# Patient Record
Sex: Female | Born: 1935 | Race: White | Hispanic: No | Marital: Married | Smoking: Never smoker
Health system: Southern US, Community
[De-identification: ages and names within clinical notes are randomized; demographics above are authoritative.]

## PROBLEM LIST (undated history)

## (undated) DIAGNOSIS — I5030 Unspecified diastolic (congestive) heart failure: Secondary | ICD-10-CM

## (undated) DIAGNOSIS — R000 Tachycardia, unspecified: Secondary | ICD-10-CM

## (undated) DIAGNOSIS — Z9889 Other specified postprocedural states: Secondary | ICD-10-CM

## (undated) DIAGNOSIS — R7989 Other specified abnormal findings of blood chemistry: Secondary | ICD-10-CM

## (undated) DIAGNOSIS — I739 Peripheral vascular disease, unspecified: Secondary | ICD-10-CM

## (undated) DIAGNOSIS — I350 Nonrheumatic aortic (valve) stenosis: Secondary | ICD-10-CM

## (undated) DIAGNOSIS — I10 Essential (primary) hypertension: Secondary | ICD-10-CM

## (undated) DIAGNOSIS — E785 Hyperlipidemia, unspecified: Secondary | ICD-10-CM

## (undated) DIAGNOSIS — C50919 Malignant neoplasm of unspecified site of unspecified female breast: Secondary | ICD-10-CM

## (undated) DIAGNOSIS — I272 Pulmonary hypertension, unspecified: Secondary | ICD-10-CM

## (undated) DIAGNOSIS — I4891 Unspecified atrial fibrillation: Secondary | ICD-10-CM

## (undated) DIAGNOSIS — K635 Polyp of colon: Secondary | ICD-10-CM

## (undated) DIAGNOSIS — C55 Malignant neoplasm of uterus, part unspecified: Secondary | ICD-10-CM

## (undated) DIAGNOSIS — K5790 Diverticulosis of intestine, part unspecified, without perforation or abscess without bleeding: Secondary | ICD-10-CM

## (undated) DIAGNOSIS — G40109 Localization-related (focal) (partial) symptomatic epilepsy and epileptic syndromes with simple partial seizures, not intractable, without status epilepticus: Secondary | ICD-10-CM

## (undated) DIAGNOSIS — R945 Abnormal results of liver function studies: Secondary | ICD-10-CM

## (undated) DIAGNOSIS — K7460 Unspecified cirrhosis of liver: Secondary | ICD-10-CM

## (undated) HISTORY — DX: Tachycardia, unspecified: R00.0

## (undated) HISTORY — DX: Peripheral vascular disease, unspecified: I73.9

## (undated) HISTORY — DX: Malignant neoplasm of unspecified site of unspecified female breast: C50.919

## (undated) HISTORY — PX: TOTAL ABDOMINAL HYSTERECTOMY: SHX209

## (undated) HISTORY — DX: Diverticulosis of intestine, part unspecified, without perforation or abscess without bleeding: K57.90

## (undated) HISTORY — DX: Unspecified diastolic (congestive) heart failure: I50.30

## (undated) HISTORY — DX: Malignant neoplasm of uterus, part unspecified: C55

## (undated) HISTORY — DX: Hyperlipidemia, unspecified: E78.5

## (undated) HISTORY — DX: Other specified abnormal findings of blood chemistry: R79.89

## (undated) HISTORY — DX: Pulmonary hypertension, unspecified: I27.20

## (undated) HISTORY — DX: Localization-related (focal) (partial) symptomatic epilepsy and epileptic syndromes with simple partial seizures, not intractable, without status epilepticus: G40.109

## (undated) HISTORY — PX: BREAST LUMPECTOMY: SHX2

## (undated) HISTORY — DX: Polyp of colon: K63.5

## (undated) HISTORY — DX: Unspecified cirrhosis of liver: K74.60

## (undated) HISTORY — DX: Other specified postprocedural states: Z98.890

## (undated) HISTORY — DX: Abnormal results of liver function studies: R94.5

---

## 2001-06-23 ENCOUNTER — Encounter: Payer: Self-pay | Admitting: Internal Medicine

## 2001-06-23 ENCOUNTER — Encounter: Admission: RE | Admit: 2001-06-23 | Discharge: 2001-06-23 | Payer: Self-pay | Admitting: Internal Medicine

## 2003-07-05 ENCOUNTER — Encounter: Payer: Self-pay | Admitting: Family Medicine

## 2003-07-05 ENCOUNTER — Encounter: Admission: RE | Admit: 2003-07-05 | Discharge: 2003-07-05 | Payer: Self-pay | Admitting: Family Medicine

## 2004-10-23 ENCOUNTER — Ambulatory Visit: Payer: Self-pay | Admitting: Internal Medicine

## 2005-02-26 ENCOUNTER — Encounter: Admission: RE | Admit: 2005-02-26 | Discharge: 2005-02-26 | Payer: Self-pay | Admitting: Internal Medicine

## 2005-03-25 ENCOUNTER — Ambulatory Visit: Payer: Self-pay | Admitting: Internal Medicine

## 2005-04-16 ENCOUNTER — Ambulatory Visit: Payer: Self-pay | Admitting: Internal Medicine

## 2005-04-29 ENCOUNTER — Ambulatory Visit: Payer: Self-pay

## 2005-07-03 ENCOUNTER — Ambulatory Visit: Payer: Self-pay | Admitting: Internal Medicine

## 2005-07-15 ENCOUNTER — Encounter: Payer: Self-pay | Admitting: Internal Medicine

## 2005-07-15 ENCOUNTER — Encounter (INDEPENDENT_AMBULATORY_CARE_PROVIDER_SITE_OTHER): Payer: Self-pay | Admitting: Specialist

## 2005-07-15 ENCOUNTER — Ambulatory Visit: Payer: Self-pay | Admitting: Internal Medicine

## 2005-10-23 ENCOUNTER — Ambulatory Visit: Payer: Self-pay | Admitting: Internal Medicine

## 2005-12-08 HISTORY — PX: MASTECTOMY: SHX3

## 2006-03-18 ENCOUNTER — Encounter: Admission: RE | Admit: 2006-03-18 | Discharge: 2006-03-18 | Payer: Self-pay | Admitting: Internal Medicine

## 2006-03-25 ENCOUNTER — Encounter: Admission: RE | Admit: 2006-03-25 | Discharge: 2006-03-25 | Payer: Self-pay | Admitting: Internal Medicine

## 2006-03-25 ENCOUNTER — Encounter (INDEPENDENT_AMBULATORY_CARE_PROVIDER_SITE_OTHER): Payer: Self-pay | Admitting: Diagnostic Radiology

## 2006-03-25 ENCOUNTER — Encounter (INDEPENDENT_AMBULATORY_CARE_PROVIDER_SITE_OTHER): Payer: Self-pay | Admitting: Specialist

## 2006-03-30 ENCOUNTER — Encounter: Admission: RE | Admit: 2006-03-30 | Discharge: 2006-03-30 | Payer: Self-pay | Admitting: Internal Medicine

## 2006-03-31 ENCOUNTER — Ambulatory Visit: Payer: Self-pay | Admitting: Internal Medicine

## 2006-04-07 ENCOUNTER — Inpatient Hospital Stay (HOSPITAL_COMMUNITY): Admission: RE | Admit: 2006-04-07 | Discharge: 2006-04-08 | Payer: Self-pay | Admitting: General Surgery

## 2006-04-07 ENCOUNTER — Encounter (INDEPENDENT_AMBULATORY_CARE_PROVIDER_SITE_OTHER): Payer: Self-pay | Admitting: *Deleted

## 2006-07-15 ENCOUNTER — Ambulatory Visit (HOSPITAL_BASED_OUTPATIENT_CLINIC_OR_DEPARTMENT_OTHER): Admission: RE | Admit: 2006-07-15 | Discharge: 2006-07-15 | Payer: Self-pay | Admitting: *Deleted

## 2006-09-17 ENCOUNTER — Ambulatory Visit: Payer: Self-pay | Admitting: Internal Medicine

## 2006-10-02 ENCOUNTER — Encounter: Payer: Self-pay | Admitting: Internal Medicine

## 2006-10-02 ENCOUNTER — Ambulatory Visit: Payer: Self-pay

## 2006-11-02 ENCOUNTER — Ambulatory Visit: Payer: Self-pay | Admitting: Cardiology

## 2007-04-01 ENCOUNTER — Ambulatory Visit: Payer: Self-pay | Admitting: Internal Medicine

## 2007-04-15 DIAGNOSIS — C50919 Malignant neoplasm of unspecified site of unspecified female breast: Secondary | ICD-10-CM | POA: Insufficient documentation

## 2007-04-15 DIAGNOSIS — I10 Essential (primary) hypertension: Secondary | ICD-10-CM | POA: Insufficient documentation

## 2007-04-15 DIAGNOSIS — Z8542 Personal history of malignant neoplasm of other parts of uterus: Secondary | ICD-10-CM | POA: Insufficient documentation

## 2007-07-20 ENCOUNTER — Telehealth (INDEPENDENT_AMBULATORY_CARE_PROVIDER_SITE_OTHER): Payer: Self-pay | Admitting: *Deleted

## 2007-10-08 ENCOUNTER — Encounter (INDEPENDENT_AMBULATORY_CARE_PROVIDER_SITE_OTHER): Payer: Self-pay | Admitting: *Deleted

## 2007-10-22 ENCOUNTER — Ambulatory Visit: Payer: Self-pay | Admitting: Internal Medicine

## 2007-10-22 DIAGNOSIS — M949 Disorder of cartilage, unspecified: Secondary | ICD-10-CM

## 2007-10-22 DIAGNOSIS — I739 Peripheral vascular disease, unspecified: Secondary | ICD-10-CM

## 2007-10-22 DIAGNOSIS — E785 Hyperlipidemia, unspecified: Secondary | ICD-10-CM

## 2007-10-22 DIAGNOSIS — M899 Disorder of bone, unspecified: Secondary | ICD-10-CM | POA: Insufficient documentation

## 2007-10-25 LAB — CONVERTED CEMR LAB
Alkaline Phosphatase: 67 units/L (ref 39–117)
BUN: 10 mg/dL (ref 6–23)
Basophils Absolute: 0 10*3/uL (ref 0.0–0.1)
CO2: 29 meq/L (ref 19–32)
Chloride: 101 meq/L (ref 96–112)
Cholesterol: 162 mg/dL (ref 0–200)
Creatinine, Ser: 0.6 mg/dL (ref 0.4–1.2)
Eosinophils Absolute: 0.1 10*3/uL (ref 0.0–0.6)
GFR calc Af Amer: 127 mL/min
GFR calc non Af Amer: 105 mL/min
Glucose, Bld: 96 mg/dL (ref 70–99)
HDL: 89.3 mg/dL (ref >39.0)
Hemoglobin: 14.7 g/dL (ref 12.0–15.0)
MCHC: 34.9 g/dL (ref 30.0–36.0)
MCV: 90.8 fL (ref 78.0–100.0)
Monocytes Absolute: 0.6 10*3/uL (ref 0.2–0.7)
Neutro Abs: 4.9 10*3/uL (ref 1.4–7.7)
Sodium: 140 meq/L (ref 135–145)
Total Bilirubin: 1.1 mg/dL (ref 0.3–1.2)
Total Protein: 7.8 g/dL (ref 6.0–8.3)
Triglycerides: 84 mg/dL (ref 0–149)

## 2007-11-09 ENCOUNTER — Encounter: Payer: Self-pay | Admitting: Internal Medicine

## 2007-11-18 ENCOUNTER — Encounter: Admission: RE | Admit: 2007-11-18 | Discharge: 2007-11-18 | Payer: Self-pay | Admitting: Internal Medicine

## 2008-02-01 ENCOUNTER — Telehealth (INDEPENDENT_AMBULATORY_CARE_PROVIDER_SITE_OTHER): Payer: Self-pay | Admitting: *Deleted

## 2008-06-20 ENCOUNTER — Ambulatory Visit: Payer: Self-pay | Admitting: Internal Medicine

## 2008-06-20 DIAGNOSIS — H9190 Unspecified hearing loss, unspecified ear: Secondary | ICD-10-CM | POA: Insufficient documentation

## 2008-06-26 ENCOUNTER — Telehealth: Payer: Self-pay | Admitting: Internal Medicine

## 2008-06-26 ENCOUNTER — Telehealth (INDEPENDENT_AMBULATORY_CARE_PROVIDER_SITE_OTHER): Payer: Self-pay | Admitting: *Deleted

## 2008-06-26 LAB — CONVERTED CEMR LAB
ALT: 18 units/L (ref 0–35)
AST: 23 units/L (ref 0–37)
BUN: 17 mg/dL (ref 6–23)
Creatinine, Ser: 1.1 mg/dL (ref 0.4–1.2)
GFR calc Af Amer: 63 mL/min
GFR calc non Af Amer: 52 mL/min
Glucose, Bld: 95 mg/dL (ref 70–99)
Sodium: 139 meq/L (ref 135–145)

## 2008-07-03 ENCOUNTER — Encounter: Payer: Self-pay | Admitting: Internal Medicine

## 2008-07-26 ENCOUNTER — Ambulatory Visit: Payer: Self-pay | Admitting: Internal Medicine

## 2008-07-28 ENCOUNTER — Telehealth (INDEPENDENT_AMBULATORY_CARE_PROVIDER_SITE_OTHER): Payer: Self-pay | Admitting: *Deleted

## 2008-07-28 LAB — CONVERTED CEMR LAB
BUN: 13 mg/dL (ref 6–23)
GFR calc Af Amer: 79 mL/min
Glucose, Bld: 98 mg/dL (ref 70–99)
Potassium: 3.9 meq/L (ref 3.5–5.1)
Sodium: 141 meq/L (ref 135–145)

## 2008-10-31 ENCOUNTER — Ambulatory Visit: Payer: Self-pay | Admitting: Internal Medicine

## 2008-11-03 ENCOUNTER — Telehealth (INDEPENDENT_AMBULATORY_CARE_PROVIDER_SITE_OTHER): Payer: Self-pay | Admitting: *Deleted

## 2008-11-03 LAB — CONVERTED CEMR LAB
Basophils Absolute: 0 10*3/uL (ref 0.0–0.1)
Eosinophils Absolute: 0.1 10*3/uL (ref 0.0–0.7)
HCT: 42.4 % (ref 36.0–46.0)
HDL: 95.6 mg/dL (ref >39.0)
Hemoglobin: 14.8 g/dL (ref 12.0–15.0)
Lymphocytes Relative: 18.1 % (ref 12.0–46.0)
MCV: 89.8 fL (ref 78.0–100.0)
Monocytes Absolute: 0.5 10*3/uL (ref 0.1–1.0)
Neutro Abs: 4.9 10*3/uL (ref 1.4–7.7)
Neutrophils Relative %: 72.2 % (ref 43.0–77.0)
RDW: 11.9 % (ref 11.5–14.6)
Total CHOL/HDL Ratio: 1.9
Vit D, 1,25-Dihydroxy: 34 (ref 30–89)
WBC: 6.7 10*3/uL (ref 4.5–10.5)

## 2009-08-03 ENCOUNTER — Ambulatory Visit: Payer: Self-pay | Admitting: Internal Medicine

## 2009-08-07 ENCOUNTER — Encounter (INDEPENDENT_AMBULATORY_CARE_PROVIDER_SITE_OTHER): Payer: Self-pay | Admitting: *Deleted

## 2009-08-07 LAB — CONVERTED CEMR LAB
ALT: 17 units/L (ref 0–35)
CO2: 31 meq/L (ref 19–32)
Calcium: 9.5 mg/dL (ref 8.4–10.5)
Chloride: 104 meq/L (ref 96–112)
Creatinine, Ser: 0.9 mg/dL (ref 0.4–1.2)
HDL: 78.5 mg/dL (ref >39.00)
LDL Cholesterol: 75 mg/dL (ref 0–99)
Potassium: 4 meq/L (ref 3.5–5.1)
Sodium: 141 meq/L (ref 135–145)
Total CHOL/HDL Ratio: 2
VLDL: 10.2 mg/dL (ref 0.0–40.0)

## 2009-12-26 ENCOUNTER — Encounter: Payer: Self-pay | Admitting: Internal Medicine

## 2010-01-23 ENCOUNTER — Ambulatory Visit: Payer: Self-pay | Admitting: Internal Medicine

## 2010-01-30 ENCOUNTER — Encounter: Admission: RE | Admit: 2010-01-30 | Discharge: 2010-01-30 | Payer: Self-pay | Admitting: Internal Medicine

## 2010-06-21 ENCOUNTER — Telehealth: Payer: Self-pay | Admitting: Internal Medicine

## 2010-06-24 ENCOUNTER — Encounter: Payer: Self-pay | Admitting: Internal Medicine

## 2010-12-29 ENCOUNTER — Encounter: Payer: Self-pay | Admitting: Internal Medicine

## 2011-01-05 LAB — CONVERTED CEMR LAB
AST: 22 units/L (ref 0–37)
BUN: 12 mg/dL (ref 6–23)
Basophils Relative: 1.6 % (ref 0.0–3.0)
CO2: 31 meq/L (ref 19–32)
Calcium: 9.4 mg/dL (ref 8.4–10.5)
Calcium: 9.7 mg/dL (ref 8.4–10.5)
Chloride: 103 meq/L (ref 96–112)
Cholesterol: 197 mg/dL (ref 0–200)
Direct LDL: 126.5 mg/dL
Eosinophils Absolute: 0.1 10*3/uL (ref 0.0–0.7)
Eosinophils Relative: 1 % (ref 0.0–5.0)
GFR calc Af Amer: 91 mL/min
GFR calc non Af Amer: 74.81 mL/min (ref >60)
GFR calc non Af Amer: 76 mL/min
Glucose, Bld: 99 mg/dL (ref 70–99)
HCT: 40.9 % (ref 36.0–46.0)
HDL: 82 mg/dL (ref >39.0)
HDL: 91 mg/dL (ref >39.00)
Hemoglobin: 13.5 g/dL (ref 12.0–15.0)
Hemoglobin: 14.6 g/dL (ref 12.0–15.0)
Lymphs Abs: 1 10*3/uL (ref 0.7–4.0)
Monocytes Absolute: 0.5 10*3/uL (ref 0.1–1.0)
Neutrophils Relative %: 69.2 % (ref 43.0–77.0)
Potassium: 4 meq/L (ref 3.5–5.1)
Potassium: 4.1 meq/L (ref 3.5–5.1)
RBC: 4.45 M/uL (ref 3.87–5.11)
Total CHOL/HDL Ratio: 2
Triglycerides: 118 mg/dL (ref 0–149)
Triglycerides: 94 mg/dL (ref 0.0–149.0)
VLDL: 24 mg/dL (ref 0–40)

## 2011-01-07 NOTE — Letter (Signed)
Summary: Colonoscopy Letter  [facility]  [address] [place], [place] [address]   Phone: [phone]  Fax: [phone]      June 24, 2010 MRN: [id number]   [facility] [address] [place], [place]  [address]   Dear Ms. Osmon,   According to your medical record, it is time for you to schedule a Colonoscopy. The American Cancer Society recommends this procedure as a method to detect early colon cancer. Patients with a family history of colon cancer, or a personal history of colon polyps or inflammatory bowel disease are at increased risk.  This letter has been generated based on the recommendations made at the time of your procedure. If you feel that in your particular situation this may no longer apply, please contact our office.  Please call our office at [phone] to schedule this appointment or to update your records at your earliest convenience.  Thank you for cooperating with [place] to provide you with the very best care possible.   Sincerely,  Wilhemina Bonito. Marina Goodell, M.D.  [facility] [phone]

## 2011-01-07 NOTE — Progress Notes (Signed)
Summary: refill   Phone Note Call from Patient   Summary of Call: Pt called stating she needs a new rx for her Simvastatin. Sent in refill to pharmacy for pt. Army Fossa CMA  June 21, 2010 2:04 PM     refilPrescriptions: SIMVASTATIN 40 MG  TABS (SIMVASTATIN) 1/4 by mouth at bedtime  #30 x 0   Entered by:   Army Fossa CMA   Authorized by:   Nolon Rod. Ahnesty Finfrock MD   Signed by:   Army Fossa CMA on 06/21/2010   Method used:   Electronically to        [facility]* (retail)             ,          Ph: [phone]       Fax: [phone]   RxID:   [id number]

## 2011-01-07 NOTE — Assessment & Plan Note (Signed)
Summary: CPX,WANTS TO FAST,MEDICARE & BCBS/RH.....   Vital Signs:  Patient profile:   76 year old female Height:      66 inches Weight:      165.6 pounds BMI:     26.83 Pulse rate:   60 / minute BP sitting:   124 / 86  Vitals Entered By: Shary Decamp (January 23, 2010 11:22 AM) CC: yearly - fasting Comments  - has breast MRI scheduled for 2/23, needs bun/creat Shary Decamp  January 23, 2010 11:22 AM    History of Present Illness: Hypertension-- good medication compliance , ambulatory BPs excellent   Hyperlipidemia-- good medication compliance   Osteopenia-- due for DEXA  h/o BREAST CANCER -- needs a MRI  Preventive Screening-Counseling & Management  Alcohol-Tobacco     Alcohol drinks/day: 2     Alcohol type: wine  Caffeine-Diet-Exercise     Does Patient Exercise: yes     Type of exercise: walks daily     Times/week: 7      Drug Use:  no.    Current Medications (verified): 1)  Simvastatin 40 Mg  Tabs (Simvastatin) .... 1/2 By Mouth At Bedtime 2)  Benazepril-Hydrochlorothiazide 20-12.5 Mg  Tabs (Benazepril-Hydrochlorothiazide) .... 2 By Mouth Once Daily 3)  Aspir-Low 81 Mg  Tbec (Aspirin) .... Once Daily  Allergies (verified): No Known Drug Allergies  Comments:  Nurse/Medical Assistant: The patient's medications were reviewed with the patient and were updated in the Medication List. Shary Decamp (January 23, 2010 11:23 AM)  Past History:  Past Medical History: Reviewed history from 08/03/2009 and no changes required. Hypertension Hyperlipidemia Osteopenia PVD, saw Dr Samule Ohm 2007, severe but Asx R Common Iliac artery stenosis October 2007 an abdominal aorta ultrasound was negative for AAA   UTERINE CANCER, HX OF --Dx in the 1990s BREAST CANCER-BILATERAL  Past Surgical History: mastectomy -bilateral  2007 Hysterectomy, oophorectomy B  Family History: colon ca-- no breast ca--daughter  MI-- F age early 71s DM-- no  Social History: Married x  50 years  4 daughters Retired, used to do office work diet-- healthy except for cheese  exercise-- daily tobacco-- never ETOH-- wine daily Does Patient Exercise:  yes Drug Use:  no  Review of Systems General:  Denies fatigue, fever, and weight loss. CV:  Denies chest pain or discomfort and swelling of feet; (-) claudication. Resp:  Denies cough and shortness of breath. GI:  Denies bloody stools, nausea, and vomiting. Psych:  Denies anxiety and depression.  Physical Exam  General:  alert, well-developed, and well-nourished.   Neck:  no masses, no thyromegaly, and normal carotid upstroke.   Lungs:  normal respiratory effort, no intercostal retractions, no accessory muscle use, and normal breath sounds.   Heart:  normal rate, regular rhythm, no murmur, and no gallop.   Abdomen:  soft and non-tender.  pulsatile mass (or Ao) at the mid  abdomen, no bruits, no tenderness Pulses:  normal  femoral  pulses Extremities:  no lower extremity edema Psych:  Cognition and judgment appear intact. Alert and cooperative with normal attention span and concentration. not anxious appearing and not depressed appearing.     Impression & Recommendations:  Problem # 1:  OSTEOPENIA (ICD-733.90) last bone density test 2006 decline to  have a repeated DEXA because she has a healthy lifestyle and is very active to call if changes her mind  Orders: Venipuncture (62130) T-Vitamin D (25-Hydroxy) (86578-46962)  Problem # 2:  HEALTH MAINTENANCE EXAM (ICD-V70.0) Td 2008 pneumonia   2008 had  shingles shot already had a flu shot   sees gynecology for her female care oncology does her breast exam  colonoscopy in 2006, had hperplastic  polyps,next Cscope per Dr Marina Goodell  Problem # 3:  PVD (ICD-443.9) asymptomatic palpable aorta on exam, this has been the case since at least 2007; note from cardiology reviewed: "The abdominal aorta is quite palpable and nontender" no AAA by ultrasound 2007   plan:  Observation, consider re-do a u/s next year  Problem # 4:  HYPERLIPIDEMIA (ICD-272.4) at goal  patient self cut simva to 1/4 a day x months  likes FLP done, if it is still ok, would like to be switched to 10mg  (officially) Her updated medication list for this problem includes:    Simvastatin 40 Mg Tabs (Simvastatin) .Marland Kitchen... 1/4 by mouth at bedtime  Labs Reviewed: SGOT: 24 (08/03/2009)   SGPT: 17 (08/03/2009)   HDL:78.50 (08/03/2009), 95.6 (10/31/2008)  LDL:75 (08/03/2009), 68 (10/31/2008)  Chol:164 (08/03/2009), 180 (10/31/2008)  Trig:51.0 (08/03/2009), 82 (10/31/2008)  Orders: TLB-Lipid Panel (80061-LIPID)  Problem # 5:  ADENOCARCINOMA, BREAST (ICD-174.9) history of breast cancer, in need of MRI w/  contrast  Will forward  BMP to oncology  Problem # 6:  HYPERTENSION (ICD-401.9) at goal  EKG sinus bradycardia, no acute changes Her updated medication list for this problem includes:    Benazepril-hydrochlorothiazide 20-12.5 Mg Tabs (Benazepril-hydrochlorothiazide) .Marland Kitchen... 2 by mouth once daily    BP today: 124/86 Prior BP: 120/82 (08/03/2009)  Labs Reviewed: K+: 4.0 (08/03/2009) Creat: : 0.9 (08/03/2009)   Chol: 164 (08/03/2009)   HDL: 78.50 (08/03/2009)   LDL: 75 (08/03/2009)   TG: 51.0 (08/03/2009)  Orders: TLB-BMP (Basic Metabolic Panel-BMET) (80048-METABOL) TLB-CBC Platelet - w/Differential (85025-CBCD) EKG w/ Interpretation (93000)  Complete Medication List: 1)  Simvastatin 40 Mg Tabs (Simvastatin) .... 1/4 by mouth at bedtime 2)  Benazepril-hydrochlorothiazide 20-12.5 Mg Tabs (Benazepril-hydrochlorothiazide) .... 2 by mouth once daily 3)  Aspir-low 81 Mg Tbec (Aspirin) .... Once daily  Patient Instructions: 1)  Please schedule a follow-up appointment in 6 months .  Prescriptions: BENAZEPRIL-HYDROCHLOROTHIAZIDE 20-12.5 MG  TABS (BENAZEPRIL-HYDROCHLOROTHIAZIDE) 2 by mouth once daily  #180 x 1   Entered by:   Shary Decamp   Authorized by:   Nolon Rod. Kellin Bartling MD   Signed by:    Shary Decamp on 01/23/2010   Method used:   Electronically to        [facility]* (mail-order)             ,          Ph: [phone]       Fax: [phone]   RxID:   [id number]    Preventive Care Screening  Prior Values:    Colonoscopy:  Diverticulosis; polyps (07/15/2005)    Last Tetanus Booster:  Td (04/01/2007)    Last Flu Shot:  Historical (09/14/2007)    Last Pneumovax:  Pneumovax (04/01/2007)    Risk Factors:  Tobacco use:  never Drug use:  no Alcohol use:  yes    Type:  wine    Drinks per day:  2 Exercise:  yes    Times per week:  7    Type:  walks daily  Colonoscopy History:    Date of Last Colonoscopy:  07/15/2005    Immunization History:  Influenza Immunization History:    Influenza:  historical (09/07/2009)

## 2011-01-07 NOTE — Letter (Signed)
Summary: Cancer Registry Form/[facility]  Cancer Registry Form/[facility]   Imported By: Lanelle Bal 01/04/2010 11:33:28  _____________________________________________________________________  External Attachment:    Type:   Image     Comment:   External Document

## 2011-03-19 ENCOUNTER — Telehealth: Payer: Self-pay | Admitting: *Deleted

## 2011-03-19 MED ORDER — BENAZEPRIL-HYDROCHLOROTHIAZIDE 20-12.5 MG PO TABS: 2.0000 | ORAL_TABLET | Freq: Every day | ORAL | Status: DC

## 2011-03-19 NOTE — Telephone Encounter (Signed)
Spoke w/ pt she schedule appt. Needs refill on Benazepril-hctz 20-12.5mg .

## 2011-03-25 ENCOUNTER — Other Ambulatory Visit: Payer: Self-pay | Admitting: *Deleted

## 2011-03-26 ENCOUNTER — Other Ambulatory Visit: Payer: Self-pay | Admitting: *Deleted

## 2011-03-26 MED ORDER — BENAZEPRIL-HYDROCHLOROTHIAZIDE 20-12.5 MG PO TABS: 2.0000 | ORAL_TABLET | Freq: Every day | ORAL | Status: DC

## 2011-03-26 NOTE — Telephone Encounter (Signed)
Pt called wanted to have rx sent to local pharmacy

## 2011-04-09 ENCOUNTER — Encounter: Payer: Self-pay | Admitting: Internal Medicine

## 2011-04-25 NOTE — Discharge Summary (Signed)
NAMENATHANIEL, [name]                ACCOUNT NO.:  [id number]   MEDICAL RECORD NO.:  [id number]          PATIENT TYPE:  INP   LOCATION:  5740                         FACILITY:  [facility]   PHYSICIAN:  Lyman Speller, MD       DATE OF BIRTH:  1935-09-14   DATE OF ADMISSION:  04/07/2006  DATE OF DISCHARGE:  04/08/2006                                 DISCHARGE SUMMARY   ADMISSION DIAGNOSIS:  Recurrent carcinoma to the right breast.   DISCHARGE DIAGNOSIS:  Recurrent carcinoma to the right breast.   OPERATIONS:  1.  Bilateral total mastectomies done by Dr. Rose Phi. Young.  2.  Placement of bilateral subpectoral tissue expanders done by Dr. Ashley Murrain      Bean.   HOSPITAL COURSE:  The patient was taken to the operating room on Apr 07, 2006; at which time, she underwent mastectomies by Dr. Maple Hudson followed by  placement of bilateral subpectoral tissue expanders by Dr. Jillyn Hidden.  During the  course of the placement of the left subpectoral tissue expander, a 5 mm tear  was placed in the left pleura.  This was aspirated and closed at the time of  surgery.  Postoperative chest x-ray revealed a minimal pneumothorax on the  left with no other significant findings.   The patient's postoperative course has otherwise been completely  unremarkable.  Her vital signs are stable and she is afebrile.  She is  tolerating her diet and her pain is well controlled.  Chest x-ray this  morning reveals the minimal pneumothorax on the left to be resolving.  Physical examination today reveals the wounds to be clean and dry.  The skin  flaps are pink and viable. The Jackson-Pratt drains are in place and  functioning without difficulty.  The patient will be discharged home today  for office follow-up.   DISCHARGE INSTRUCTIONS:  The patient is advised to engage in no strenuous  upper extremity activity.  Her current dressing is to be left in place until  seen by me on Friday.  I have instructed her not to drive for one week  and  no heavy lifting for three weeks.  She may walk up steps.  I have advised  her to sponge bath only until such time as the drains are removed.   DISCHARGE MEDICATIONS:  1.  Percocet tablets 1-2 every 4 hours p.r.n. pain, dispense #40.  2.  Cephalexin 250 mg p.o. q.i.d. for 7 days.   FOLLOW UP:  Office will contact the patient to schedule follow up for  Friday, Apr 10, 2006.   FINAL DIAGNOSIS:  Breast carcinoma as noted above.     Lyman Speller, MD  Electronically Signed    CWB/MEDQ  D:  04/08/2006  T:  04/09/2006  Job:  [id number]

## 2011-04-25 NOTE — Progress Notes (Signed)
[facility]                          PERIPHERAL VASCULAR OFFICE NOTE   NAME:[name], [name]                       MRN:          [id number]  DATE:11/02/2006                            DOB:          [birth year]    REASON FOR CONSULTATION:  Patient referred by Dr. Drue Novel for right common iliac  stenosis noted on abdominal aortic ultrasound.   HISTORY OF PRESENT ILLNESS:  [name] is a 76 year old lady with  hypertension.  She is quite active.  She walks 3 miles every day under an  hour, including doing hills. With this, she has absolutely no discomfort in  either leg.   Dr. Drue Novel recently felt pulsatile aorta and ordered an abdominal aortic  ultrasound to exclude and aneurysm.  Showed no evidence of aneurysm but  showed a severe stenosis at the ostium of the right common iliac artery with  a peak systolic velocity of 481 cm per second.   PAST MEDICAL HISTORY:  1. Aortic sclerosis.  2. Hypertension.  3. Status post bilateral mastectomies for breast cancer in 2007.  4. Hysterectomy 1993.  5. Right ankle surgery in 1985.  6. Status post bilateral breast augmentation post mastectomy.   ALLERGIES:  NKDA.   CURRENT MEDICATIONS:  1. Aspirin 81 mg per day.  2. Diovan HCTZ 160/12.5, one per day.  3. Fish oil 2000 mg per day.  4. Calcium daily.   SOCIAL HISTORY:  Patient is a homemaker.  She walks three miles per day.  She has 4 grown children and 12 grandchildren, all of whom live nearby.  She  enjoys reading and traveling.  She has never smoked and denies alcohol or  illicit drug use.   FAMILY HISTORY:  Father died at 57 of myocardial infarction.  Mother died at  61 of renal failure.  Her brother died at 8 of cirrhosis, and another  brother died at 85 of heart disease in the setting of diabetes.  Three other  siblings are alive with hypertension in their 93s and 81s.   REVIEW OF SYSTEMS:  Wears glasses, has occasional vertigo.  Review of  systems is  otherwise negative in detail, except as above.   PHYSICAL EXAMINATION:  GENERAL:  She is generally well-appearing, in no  distress with a heart rate of 74, blood pressure 120/62 on the right and  122/64 on the left.  VITAL SIGNS:  She is 5 feet 6 inches tall and weighs 157 pounds.  HEENT:  Normal.  SKIN:  Normal.  MUSCULOSKELETAL:  Normal.  NECK:  She has no jugular venous distention, thyromegaly or lymphadenopathy.  CHEST:  Respiratory effort is normal.  LUNGS:  Clear to auscultation.  HEART:  She has a regular rate and rhythm with a 2/6 systolic ejection  murmur at the right upper sternal border without radiation.  ABDOMEN:  There is no S3 or S4.  The abdomen is soft, nondistended,  nontender.  There is no hepatosplenomegaly.  Bowel sounds are normal.  Bowel  sounds are normal.  The abdominal aorta is quite palpable and nontender.  Soft bruit on the right lower  quadrant.  Femoral pulses 2+ bilateral without  bruits.  Popliteal pulses 2+ bilaterally.  DP and PT pulses 2+ bilaterally.  NEURO:  She is alert and oriented x3 with normal affect.   STUDIES:  Abdominal aortic ultrasound performed on October 02, 2006  demonstrates a normal caliper abdominal aorta and proximal ileac arteries.  There is mild atherosclerosis in the mid-aorta and a severe stenosis of the  right common iliac artery.  I reviewed this study personally today and agree  with the findings.   IMPRESSION/RECOMMENDATIONS:  [name] has severe stenosis of the right  common iliac artery.  It is, however, absolutely asymptomatic.  I therefore  recommend conservative management.  I have strongly advised her to continue  her daily walking regimen, as I think this is the reason that she is  asymptomatic.  She is happy to do so.   I did explain to her that this finding marks her as having atherosclerotic  disease.  We talked about aggressive secondary prevention for minimization  of the risk of myocardial infarction and  stroke.  She will continue on her  aspirin and Diovan.  I did not have access to her lipid profile.  Will leave  management to Dr. Drue Novel.  Suggest that goal LDL now be certainly less than  100, with consideration of treatment to a goal of less than 70.   As her asymptomatic status is likely to continue, I have now scheduled her  for followup with myself but would be happy to see her should her situation  change.     Salvadore Farber, MD  Electronically Signed    WED/MedQ  DD: 11/02/2006  DT: 11/02/2006  Job #: [id number]   cc:   Willow Ora, MD

## 2011-04-25 NOTE — Op Note (Signed)
[name], [name]                ACCOUNT NO.:  [id number]   MEDICAL RECORD NO.:  [id number]          PATIENT TYPE:  INP   LOCATION:  2550                         FACILITY:  [facility]   PHYSICIAN:  Lyman Speller, MD       DATE OF BIRTH:  [birth year]   DATE OF PROCEDURE:  04/07/2006  DATE OF DISCHARGE:                                 OPERATIVE REPORT   SURGEON:  Lyman Speller, M.D.   PREOPERATIVE DIAGNOSIS:  Right breast carcinoma.   POSTOPERATIVE DIAGNOSIS:  Right breast carcinoma.   OPERATIONS PERFORMED:  1.  Bilateral total mastectomy done by Dr. Francina Ames.  2.  Placement of bilateral subpectoral tissue expanders, done by Dr. Jillyn Hidden.   ANESTHESIA:  General endotracheal.   DESCRIPTION OF OPERATION:  Care of the patient was assumed from Dr. Maple Hudson  after completion of the mastectomies.  At this point, the mastectomy wounds  were open and hemostatic and packed with saline-moistened sponges.  Procedure was begun on the right.  The lateral border of the pectoralis  major muscle was identified, and careful sharp dissection was used to create  a subpectoral pocket.  This pocket was extended medially to the lateral  border of the sternum and inferiorly to the native inframammary fold.  Meticulous hemostasis was maintained throughout using the Bovie  electrocautery.  At this point, the Bovie electrocautery was used to incise  the serratus anterior muscle along the lateral border of the pectoralis  major muscle.  The serratus was then elevated as a separate muscle flap  laterally.  This was continued to allow a sufficient muscle to cover the  lateral aspect of the tissue expander.  It is noted that bilaterally the  pectoralis major muscle was significantly attenuated by radiation.  This was  far more substantial on the inferior portion of the pectoralis on the left  side.  This also involved the serratus anterior.  There was an area  measuring approximately 6 cm in diameter in which the  muscles were both  primarily fibrotic scar tissue.  On the left side, during the course of the  dissection of the serratus anterior muscle, a small inadvertent perforation  of the pleura occurred.  This measured approximately 5 mm in diameter.  This  area was closed using a horizontal mattress suture of 3-0 Vicryl.  A 16  French rubber Robinson catheter was used to aspirate any air from the left  chest cavity prior to tying of the suture.  At this point, the tissue  expanders were then inserted into the pockets bilaterally.  It should be  noted that the expanders were evacuated of air, soaked in antibiotic  solution, and prefilled with 50 cc of injectable saline.  The lateral border  of the pectoralis major muscle was then closed to the anterior edge of the  serratus anterior flap.  This was done along the entire border of the muscle  and facilitated complete muscular coverage of both tissue expanders.  At  this time, the wounds were thoroughly irrigated, and all remaining bleeding  controlled with the Bovie  electrocautery.  Two flat 10 mm Jackson-Pratt  drains were placed, one on either side.  These were exited by separate stab  wound incisions on the lateral thorax.  The drains were secured at skin  level using a 2-0 silk suture.  At this point, the deep dermal aspect of the  wounds was closed with multiple buried interrupted sutures of 3-0 Vicryl.  This was done to the point of creating a watertight wound.  Skin edge  approximation was then obtained with multiple surgical steel staples.  The  Jackson-Pratt drains were placed to bulb suction.  A bulky moderately  compressive dressing was then placed.  The final sponge and needle count was  noted to be correct.  The total estimated blood loss was 50 cc for the  combined procedures.  The patient received 1850 cc of crystalloid during the  course of the operation.   COMPLICATIONS:  Inadvertent puncture of the left pleura, which was  repaired.  The patient will get an upright chest x-ray in the recovery room to ensure  that there is no significant pneumothorax.  This will also be followed by  the general surgery service.      Lyman Speller, MD  Electronically Signed     CWB/MEDQ  D:  04/07/2006  T:  04/07/2006  Job:  [id number]

## 2011-04-25 NOTE — Op Note (Signed)
NAMEDEVINN, [name]                ACCOUNT NO.:  [id number]   MEDICAL RECORD NO.:  [id number]          PATIENT TYPE:  AMB   LOCATION:  [facility]                         FACILITY:  [facility]   PHYSICIAN:  Lyman Speller, MD       DATE OF BIRTH:  04/27/1935   DATE OF PROCEDURE:  07/15/2006  DATE OF DISCHARGE:                                 OPERATIVE REPORT   SURGEON:  C. Gerrie Nordmann, MD   PREOPERATIVE DIAGNOSIS:  Status post bilateral mastectomy and bilateral  subpectoral tissue expansion.   POSTOPERATIVE DIAGNOSIS:  Status post bilateral mastectomy and bilateral  subpectoral tissue expansion.   PROCEDURE:  Placement of bilateral permanent breast implants.   ANESTHESIA:  General laryngeal mask.   DESCRIPTION OF OPERATION:  The patient was seen in preoperative holding  area, at which time the procedure as well as the risks and benefits were  reviewed.  The preoperative markings were placed and the patient did wish to  proceed as planned.  She was then taken to the operating room and placed on  the table in supine position.  The chest was prepped and draped in the usual  sterile fashion.  The procedure was begun on the right with incision through  approximately a 5 cm portion of the old scar in the lateral aspect of the  breast.  This dissection was then carried to the level of the pectoralis  major muscle.  The skin was elevated inferiorly over approximately a 2 cm  distance and the pectoralis major muscle was opened using the Bovie  electrocautery at this level.  The tissue expander was identified and then  perforated with a #15 blade and evacuated of the saline.  The tissue  expander was then removed without difficulty.  The Bovie electrocautery was  then used to perform a capsulotomy along the inferior pole of the breast and  extending anteriorly onto the flaps.  The pocket was then thoroughly  irrigated with antibiotic solution.  The breast implants were then brought  onto the field.   These were noted to be Mentor smooth round silicone  implants at 500 mL volume.  These were then placed into the respective  pockets.  The pectoralis major muscle was then closed with interrupted  sutures of 3-0 Monocryl.  It is of note that on both sides the patient had  rather significant depression of the ribs on both sides secondary to the  tissue expansion.  Additionally, the patient was noted to have relatively  poor healing to the pectoralis major muscle and the muscle was somewhat  friable.  Once the muscle was closed, the deep dermal aspect of the skin was  closed with multiple buried interrupted sutures of 3-0 Monocryl.  The skin  edge was then reapproximated with multiple surgical steel staples.  The  final sponge and needle count was correct.  The total estimated blood loss  was less than 25 mL.  A bulky, mildly compressive dressing was placed.  The  patient was then awakened from general anesthesia without difficulty and  transported to the recovery room  in stable condition.  She will be  discharged home later today barring any unforeseen complications.  No  complications were identified throughout the course of the surgery.      Lyman Speller, MD  Electronically Signed    CWB/MEDQ  D:  07/15/2006  T:  07/15/2006  Job:  [id number]

## 2011-04-25 NOTE — Op Note (Signed)
NAMECHYANNE, KOHUT                ACCOUNT NO.:  [id number]   MEDICAL RECORD NO.:  [id number]          PATIENT TYPE:  INP   LOCATION:  2899                         FACILITY:  [facility]   PHYSICIAN:  Rose Phi. Maple Hudson, M.D.   DATE OF BIRTH:  10-Mar-1935   DATE OF PROCEDURE:  04/07/2006  DATE OF DISCHARGE:                                 OPERATIVE REPORT   PREOPERATIVE DIAGNOSIS:  Recurrent right breast cancer in a BRCA1 positive  patient.   POSTOPERATIVE DIAGNOSIS:  Recurrent right breast cancer in a BRCA1 positive  patient.   OPERATION:  1.  Bilateral total mastectomies to be followed by insertion of tissue      expanders by Dr. Meriel Pica. Bean.   SURGEON:  Dr. Francina Ames   ASSISTANT:  Dr. Lebron Conners   ANESTHESIA:  General.   OPERATIVE PROCEDURE:  This 77 year old female had originally had a carcinoma  of the left breast treated in [place], [place] with a lumpectomy, axillary  node dissection and radiation therapy with chemo in 1983.  She developed a  right breast cancer in 1989 treated with lumpectomy, axillary node  dissection, radiation therapy, and tamoxifen.  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  She is now scheduled for bilateral  total mastectomies with the insertion of tissue expanders.   After suitable general anesthesia was induced, the patient was placed in the  supine position with the arms extended on the arm board.  She was then  prepped from the neck to the waist and from table edge to table edge and  then draped out.   I did the left side first with a transverse elliptical incision,  incorporating the nipple-areolar complex.  The incisions were made and the  flaps dissected in the standard fashion going superiorly to near the  clavicle and then medially to the medial sternal border and then inferiorly  to the inframammary fold and laterally to the latissimus dorsi muscle.  We  then removed the breast by dissecting from medial to  lateral taking the  pectoralis fascia.   With good hemostasis, I put some gauze pads in the incisions that were  moistened and covered this with a towel.   I then turned my attention to the right side which is where she has a  recurrent cancer.  Transverse elliptical incision was then made, and I  really could not go up to the level of the old scar which was in the very  upper portion of her right breast.  We did make our incisions and then  dissected our flaps in the standard fashion and removed the breast, again,  as we had done it on the left side.   At the end of this, though, there was a nodule under the old lumpectomy scar  and upper portion of the breast, and I excised that submitted it for frozen  section.  Frozen section showed no evidence of malignancy but simply  calcifications and radiation scar.   With good hemostasis, Dr. Shelle Iron came in and was to do the  tissue expanders,  and that will be dictated in a separate note.      Rose Phi. Maple Hudson, M.D.  Electronically Signed     PRY/MEDQ  D:  04/07/2006  T:  04/07/2006  Job:  [id number]   cc:   Lesle Reek, MD  [facility]

## 2011-05-07 ENCOUNTER — Encounter: Payer: Self-pay | Admitting: Internal Medicine

## 2011-07-09 DIAGNOSIS — G40109 Localization-related (focal) (partial) symptomatic epilepsy and epileptic syndromes with simple partial seizures, not intractable, without status epilepticus: Secondary | ICD-10-CM

## 2011-07-09 HISTORY — DX: Localization-related (focal) (partial) symptomatic epilepsy and epileptic syndromes with simple partial seizures, not intractable, without status epilepticus: G40.109

## 2011-07-22 ENCOUNTER — Ambulatory Visit (INDEPENDENT_AMBULATORY_CARE_PROVIDER_SITE_OTHER): Payer: Medicare Other | Admitting: Internal Medicine

## 2011-07-22 ENCOUNTER — Encounter: Payer: Self-pay | Admitting: Internal Medicine

## 2011-07-22 DIAGNOSIS — Z136 Encounter for screening for cardiovascular disorders: Secondary | ICD-10-CM

## 2011-07-22 DIAGNOSIS — G459 Transient cerebral ischemic attack, unspecified: Secondary | ICD-10-CM

## 2011-07-22 DIAGNOSIS — E785 Hyperlipidemia, unspecified: Secondary | ICD-10-CM

## 2011-07-22 DIAGNOSIS — R42 Dizziness and giddiness: Secondary | ICD-10-CM

## 2011-07-22 DIAGNOSIS — I10 Essential (primary) hypertension: Secondary | ICD-10-CM

## 2011-07-22 LAB — COMPREHENSIVE METABOLIC PANEL
ALT: 19 U/L (ref 0–35)
AST: 25 U/L (ref 0–37)
Alkaline Phosphatase: 80 U/L (ref 39–117)
BUN: 16 mg/dL (ref 6–23)
Chloride: 103 mEq/L (ref 96–112)
Creatinine, Ser: 0.9 mg/dL (ref 0.4–1.2)
GFR: 68.54 mL/min (ref >60.00)
Glucose, Bld: 103 mg/dL — ABNORMAL HIGH (ref 70–99)
Potassium: 4.6 mEq/L (ref 3.5–5.1)
Sodium: 141 mEq/L (ref 135–145)
Total Bilirubin: 0.6 mg/dL (ref 0.3–1.2)

## 2011-07-22 LAB — CBC WITH DIFFERENTIAL/PLATELET
Basophils Absolute: 0 10*3/uL (ref 0.0–0.1)
Eosinophils Absolute: 0.1 10*3/uL (ref 0.0–0.7)
Lymphs Abs: 1 10*3/uL (ref 0.7–4.0)
MCHC: 33.3 g/dL (ref 30.0–36.0)
MCV: 92.2 fl (ref 78.0–100.0)
Monocytes Absolute: 0.6 10*3/uL (ref 0.1–1.0)
Neutrophils Relative %: 74.6 % (ref 43.0–77.0)
Platelets: 224 10*3/uL (ref 150.0–400.0)
RBC: 4.93 Mil/uL (ref 3.87–5.11)
WBC: 6.4 10*3/uL (ref 4.5–10.5)

## 2011-07-22 LAB — LIPID PANEL
Cholesterol: 286 mg/dL — ABNORMAL HIGH (ref 0–200)
Total CHOL/HDL Ratio: 3

## 2011-07-22 LAB — SEDIMENTATION RATE: Sed Rate: 11 mm/hr (ref 0–22)

## 2011-07-22 LAB — LDL CHOLESTEROL, DIRECT: Direct LDL: 185.5 mg/dL

## 2011-07-22 NOTE — Progress Notes (Signed)
  Subjective:    Patient ID: [name], female    DOB: [birth year], 76 y.o.   MRN: [id number]  HPI  2 weeks ago and for 24 hours her husband noted the patient "was not acting herself and was stumbling when walking", she was slightly dizzy but that's nothing new to her. Symptoms self resolved and they have not come back. Since the last year, she discontinue her cholesterol medication but has been taking her BP meds as usual. Ambulatory blood pressures are checked from time to time and are rather in the low side,  ~ 95-100/6   Past Medical History: Hypertension Hyperlipidemia Osteopenia PVD, saw Dr Samule Ohm 2007, severe but Asx R Common Iliac artery stenosis October 2007 an abdominal aorta ultrasound was negative for AAA   UTERINE CANCER, HX OF --Dx in the 1990s BREAST CANCER-BILATERAL  Past Surgical History: mastectomy -bilateral  2007 Hysterectomy, oophorectomy B   Review of Systems No chest pain or palpitations The day of the events, she did not have any slurred speech, diplopia, face was symmetric. Question of decrease strength on her left arm for a few hours. Denies any emotional distress No headaches Nonnodular, vomiting, diarrhea She has been taking 2 aspirins a day for an unrelated issue for a few months       Objective:   Physical Exam  Constitutional: She is oriented to person, place, and time. She appears well-developed and well-nourished. No distress.  HENT:  Head: Normocephalic and atraumatic.  Eyes: Conjunctivae and EOM are normal. Pupils are equal, round, and reactive to light.  Neck: No thyromegaly present.       Carotid pulses present bilaterally, question of a bruit on the left  Cardiovascular: Normal rate, regular rhythm and normal heart sounds.        Question of a mild systolic murmur  Pulmonary/Chest: Effort normal and breath sounds normal. No respiratory distress. She has no wheezes. She has no rales.  Abdominal: Soft. Bowel sounds are normal. She  exhibits no distension. There is no tenderness. There is no rebound.  Musculoskeletal: She exhibits no edema.  Neurological: She is alert and oriented to person, place, and time.       Speech clear, motor exam symmetric, DTRs slightly decreased throughout but symmetric.  Skin: She is not diaphoretic.  Psychiatric: She has a normal mood and affect. Her behavior is normal. Judgment and thought content normal.          Assessment & Plan:  TIA: Symptoms consistent with TIA. Plan:  EKG, no acute , no change from previous  Aspirin 325 although may need to consider plavix as she was taking ASA before (for pain) echocardiogram, carotid ultrasound, brain MRI, risk  factors contro  (see below). ER if symptoms recur  HTN Reports good medication compliance, overcontrol? Rec to call if BP consistently < 110/60 LABS   HYPERLIPIDEMIA  Used to take statins, labs

## 2011-07-22 NOTE — Patient Instructions (Signed)
ER if symptoms came back

## 2011-07-23 ENCOUNTER — Other Ambulatory Visit: Payer: Medicare Other

## 2011-07-23 ENCOUNTER — Telehealth: Payer: Self-pay | Admitting: Internal Medicine

## 2011-07-23 DIAGNOSIS — G459 Transient cerebral ischemic attack, unspecified: Secondary | ICD-10-CM | POA: Insufficient documentation

## 2011-07-23 NOTE — Telephone Encounter (Signed)
Please  arrange the following tests: Echocardiogram Carotid ultrasound MRI/MRA of the brain DX : TIA

## 2011-07-23 NOTE — Assessment & Plan Note (Signed)
Reports good medication compliance, overcontrol? Rec to call if BP consistently < 110/60 LABS

## 2011-07-23 NOTE — Assessment & Plan Note (Signed)
  Used to take statins, labs

## 2011-07-23 NOTE — Assessment & Plan Note (Signed)
  Symptoms consistent with TIA. Plan:  EKG, no acute , no change from previous  Aspirin 325 although may need to consider plavix as she was taking ASA before (for pain) echocardiogram, carotid ultrasound, brain MRI, risk  factors control  (see below). ER if symptoms recur

## 2011-07-24 ENCOUNTER — Ambulatory Visit (HOSPITAL_COMMUNITY): Payer: Medicare Other | Attending: Internal Medicine

## 2011-07-24 ENCOUNTER — Ambulatory Visit
Admission: RE | Admit: 2011-07-24 | Discharge: 2011-07-24 | Disposition: A | Discharge status: Home / Self Care | Payer: Medicare Other | Source: Ambulatory Visit | Attending: Internal Medicine | Admitting: Internal Medicine

## 2011-07-24 DIAGNOSIS — G459 Transient cerebral ischemic attack, unspecified: Secondary | ICD-10-CM

## 2011-07-24 DIAGNOSIS — I10 Essential (primary) hypertension: Secondary | ICD-10-CM | POA: Insufficient documentation

## 2011-07-24 DIAGNOSIS — I359 Nonrheumatic aortic valve disorder, unspecified: Secondary | ICD-10-CM | POA: Insufficient documentation

## 2011-07-24 DIAGNOSIS — I379 Nonrheumatic pulmonary valve disorder, unspecified: Secondary | ICD-10-CM | POA: Insufficient documentation

## 2011-07-24 DIAGNOSIS — I079 Rheumatic tricuspid valve disease, unspecified: Secondary | ICD-10-CM | POA: Insufficient documentation

## 2011-07-24 DIAGNOSIS — E785 Hyperlipidemia, unspecified: Secondary | ICD-10-CM | POA: Insufficient documentation

## 2011-07-24 NOTE — Telephone Encounter (Signed)
Addended by: Doristine Devoid on: 07/24/2011 12:55 PM   Modules accepted: Orders

## 2011-07-25 ENCOUNTER — Telehealth: Payer: Self-pay | Admitting: Internal Medicine

## 2011-07-25 ENCOUNTER — Ambulatory Visit (HOSPITAL_BASED_OUTPATIENT_CLINIC_OR_DEPARTMENT_OTHER)
Admission: RE | Admit: 2011-07-25 | Discharge: 2011-07-25 | Disposition: A | Discharge status: Home / Self Care | Payer: Medicare Other | Source: Ambulatory Visit | Attending: Internal Medicine | Admitting: Internal Medicine

## 2011-07-25 DIAGNOSIS — Z8679 Personal history of other diseases of the circulatory system: Secondary | ICD-10-CM

## 2011-07-25 DIAGNOSIS — I10 Essential (primary) hypertension: Secondary | ICD-10-CM | POA: Insufficient documentation

## 2011-07-25 DIAGNOSIS — G459 Transient cerebral ischemic attack, unspecified: Secondary | ICD-10-CM | POA: Insufficient documentation

## 2011-07-25 NOTE — Telephone Encounter (Signed)
Advised patient [name] is very high,  start Lipitor 40 mg 1 by mouth each bedtime, call #30, 3 refills. Sugar slightly high, add a A1C Echocardiogram essentially normal (veery mild aortic regurgitation, we can discuss on RTC) MRI/MRA okay, like most people her age she has  mild chronic microvascular ischemia which is essentially atherosclerosis of the brain. The treatment is aspirin and [name] medication. F/u as planned

## 2011-07-29 NOTE — Telephone Encounter (Signed)
New med sent to pharmacy. Results mailed. LMOM to inform Pt.

## 2011-08-22 ENCOUNTER — Encounter: Payer: Self-pay | Admitting: Internal Medicine

## 2011-08-22 ENCOUNTER — Ambulatory Visit (INDEPENDENT_AMBULATORY_CARE_PROVIDER_SITE_OTHER): Payer: Medicare Other | Admitting: Internal Medicine

## 2011-08-22 DIAGNOSIS — E785 Hyperlipidemia, unspecified: Secondary | ICD-10-CM

## 2011-08-22 DIAGNOSIS — G459 Transient cerebral ischemic attack, unspecified: Secondary | ICD-10-CM

## 2011-08-22 DIAGNOSIS — I10 Essential (primary) hypertension: Secondary | ICD-10-CM

## 2011-08-22 MED ORDER — ATORVASTATIN CALCIUM 40 MG PO TABS: 40.0000 mg | ORAL_TABLET | Freq: Every day | ORAL | Status: DC

## 2011-08-22 NOTE — Assessment & Plan Note (Addendum)
Had symptoms consistent with a TIA-----> MRI, MRA, carotid ultrasound and echocardiogram okay. No further episodes. Discussed with patient the need to keep her cholesterol, blood pressure well controlled and continue taking aspirin.

## 2011-08-22 NOTE — Progress Notes (Signed)
  Subjective:    Patient ID: [name], female    DOB: July 13, 1935, 76 y.o.   MRN: [id number]  HPI Followup from previous visit.  Past Medical History  Diagnosis Date  . TIA (transient ischemic attack) 8-12   No past surgical history on file.   Review of Systems Feeling well, no further TIA like events. She took Lipitor for a while but self d/c. No side effects . Not taking aspirin regularly. Ambulatory blood pressures 102/68 in the mornings and around 120/64 in the afternoons. Today BP elevated here at the office.     Objective:   Physical Exam  Constitutional: She is oriented to person, place, and time. She appears well-developed and well-nourished. No distress.  Eyes: EOM are normal. Pupils are equal, round, and reactive to light.  Cardiovascular: Normal rate and regular rhythm.   No murmur heard. Pulmonary/Chest: Effort normal and breath sounds normal. No respiratory distress. She has no wheezes. She has no rales.  Neurological: She is oriented to person, place, and time.       Speech, gait and motor intact  Skin: She is not diaphoretic.  Psychiatric:       Slightly apprehensive          Assessment & Plan:

## 2011-08-22 NOTE — Assessment & Plan Note (Addendum)
BP very  well controlled at home but elevated today here. I rechecked the BP myself and obtained 180/80 in both arms. Plan: No change on medication for now, see instructions. Consider adjustment of medications

## 2011-08-22 NOTE — Patient Instructions (Signed)
Schedule a nurse visit next week for a BP check Bring your BP cuff (BP machine) with you  to compare against ours and be sure is calibrated Check the  blood pressure 5 to 6  times a week, be sure it is less than 140/85. If it is consistently higher, let me know

## 2011-08-22 NOTE — Assessment & Plan Note (Addendum)
took Lipitor without side effects for some days then self discontinued it. Explained to patient the need to keep  her cholesterol controlled. Restart Lipitor .

## 2011-08-23 ENCOUNTER — Encounter: Payer: Self-pay | Admitting: Internal Medicine

## 2011-09-08 ENCOUNTER — Encounter: Payer: Self-pay | Admitting: Internal Medicine

## 2011-09-08 ENCOUNTER — Ambulatory Visit (INDEPENDENT_AMBULATORY_CARE_PROVIDER_SITE_OTHER): Payer: Medicare Other | Admitting: Internal Medicine

## 2011-09-08 VITALS — BP 158/82 | HR 91 | Temp 98.3°F | Resp 16 | Wt 181.5 lb

## 2011-09-08 DIAGNOSIS — R569 Unspecified convulsions: Secondary | ICD-10-CM | POA: Insufficient documentation

## 2011-09-08 DIAGNOSIS — G459 Transient cerebral ischemic attack, unspecified: Secondary | ICD-10-CM

## 2011-09-08 NOTE — Assessment & Plan Note (Signed)
See seizures.

## 2011-09-08 NOTE — Patient Instructions (Signed)
We are referring you to a neurologist today (or tomorrow) ER if symptoms resurface Start lipitor

## 2011-09-08 NOTE — Assessment & Plan Note (Addendum)
Having repeated neurological events, previously thought to be TIAs. Today's history (shakiness, postictal) makes me think the episodes represent seizures. The recent  workup included MRI, MRA, echo and carotid ultrasound all of them okay. Plan:  Neurological evaluation within 24 hours We discussed possible admission to the hospital but both the husband and I agreed that this could be handle as an outpatient as long  they got to the ER if symptoms resurface. Patient does not need to drive.

## 2011-09-08 NOTE — Progress Notes (Signed)
  Subjective:    Patient ID: [name], female    DOB: 1935/08/07, 76 y.o.   MRN: [id number]  HPI See previous OV notes She is seen acutely today, this morning she was standing up in the kitchen, her husband noted her arms were shaking, he asked her if she was okay, she did not responde. There was no loss of consciousness, symptoms gradually subsided, eventually she was responsive but she did not have any recollection of what happened. Back in 08/09/2011, the husband reports that at  night  "the whole bed was shaking", he decided not to wake her up but he suspects she had a seizure.  Past Medical History  Diagnosis Date  . TIA (transient ischemic attack) 8-12  . HTN (hypertension)   . Hyperlipidemia   . PVD (peripheral vascular disease)     saw Dr Samule Ohm 2007, severe but Asx R Common Iliac artery stenosis, u/s showed no AAA  . Uterine cancer 90s  . Breast ca     h/o , bilateral   Past Surgical History  Procedure Date  . Mastectomy 2007    bilateral  . Total abdominal hysterectomy 90    abdominal ? vaginal?     Review of Systems During the episodes she has not complained of nausea, vomiting, headache, chest pain, shortness of breath or palpitations. No fevers. Good medication compliance with aspirin, has not started Lipitor.    Objective:   Physical Exam  Constitutional: She appears well-developed and well-nourished.  Cardiovascular: Normal rate, regular rhythm and normal heart sounds.   No murmur heard. Pulmonary/Chest: Effort normal and breath sounds normal. No respiratory distress. She has no wheezes. She has no rales.  Neurological:       Alert, oriented to self, recognizes her husband, knows her date of birth,  Thinks is Wednesday on September (today is Monday, October 1). Has no recollection of this morning events. He seems moderately anxious and slightly shaky but no actual tremors or seizure activity. Face is symmetric, motor symmetric, speech fluent, gait normal,  DTRs symmetric          Assessment & Plan:

## 2011-09-09 ENCOUNTER — Ambulatory Visit (INDEPENDENT_AMBULATORY_CARE_PROVIDER_SITE_OTHER): Payer: Medicare Other | Admitting: Neurology

## 2011-09-09 ENCOUNTER — Encounter: Payer: Self-pay | Admitting: Neurology

## 2011-09-09 ENCOUNTER — Other Ambulatory Visit (INDEPENDENT_AMBULATORY_CARE_PROVIDER_SITE_OTHER): Payer: Medicare Other

## 2011-09-09 ENCOUNTER — Ambulatory Visit: Payer: Medicare Other | Admitting: Internal Medicine

## 2011-09-09 VITALS — BP 142/88 | HR 76 | Wt 180.0 lb

## 2011-09-09 DIAGNOSIS — R569 Unspecified convulsions: Secondary | ICD-10-CM

## 2011-09-09 LAB — BASIC METABOLIC PANEL
CO2: 28 mEq/L (ref 19–32)
Calcium: 9.1 mg/dL (ref 8.4–10.5)
GFR: 63.38 mL/min (ref >60.00)
Potassium: 3.6 mEq/L (ref 3.5–5.1)
Sodium: 141 mEq/L (ref 135–145)

## 2011-09-09 NOTE — Progress Notes (Signed)
Dear Dr. Drue Novel,  Thank you for having me see [name] in consultation today at [facility] for her problem with spells of transient confusion.  As you may recall, she is a 76 y.o. year old female with a history of breast and uterine cancer who on August 1st had a spell where her husband found her sitting in an atypical place and had a paucity of speech.  During a walk later that day she appeared to have arrest of behavior and was confused, with posturing of her left arm in a flexed position.  She had no recollection of this spell and slowly returned to normal.    On September 1st she was staying at a hotel with her husband and had two episodes of shaking during sleep that lasted about 30 seconds.  Finally, yesterday she had a spell standing at the counter cooking where she had bilateral shaking with unresponsiveness.  She was confused for about 1 hour afterwards.   No tongue biting, bladder loss with the events.  You ordered an MRI brain and MRA of the head with doppler U/S after the first episode.  It was unremarkable for a cause of the spell.  Medical History: Uterine and breast CA BRCA1 +.   - no febrile seizures, no head trauma, no intracranial infections, normal birth.   Past Surgical History  Procedure Date  . Mastectomy 2007    bilateral  . Total abdominal hysterectomy 90    abdominal ? vaginal?       History   Social History  . Marital Status: Married    Spouse Name: N/A    Number of Children: N/A  . Years of Education: N/A   Social History Main Topics  . Smoking status: Never Smoker   . Smokeless tobacco: Never Used  . Alcohol Use: None  . Drug Use: None  . Sexually Active: None   Other Topics Concern  . None   Social History Narrative  . None     Family History: brother had seizures from an unknown cause during late life.   ROS:  13 systems were reviewed and are notable for no significant headaches or other neurologic symptoms.  Patient has  infrequent vertigo.  All other review of systems are unremarkable.   Examination:  Filed Vitals:   09/09/11 1133  BP: 142/88  Pulse: 76  Weight: 180 lb (81.647 kg)     In general, well appearing older woman.  Cardiovascular: The patient has a regular rate and rhythm and no carotid bruits.  Fundoscopy:  Disks are flat. Vessel caliber within normal limits.  Normal SVP.  Mental status:   The patient is oriented to person, place and time. Recent and remote memory are intact. Attention span and concentration are normal. Language including repetition, naming, following commands are intact. Fund of knowledge of current and historical events, as well as vocabulary are normal.  Cranial Nerves: Pupils are equally round and reactive to light. Visual fields full to confrontation. Extraocular movements are intact without nystagmus. Facial sensation and muscles of mastication are intact. Muscles of facial expression are symmetric. Hearing intact to bilateral finger rub. Tongue protrusion, uvula, palate midline.  Shoulder shrug intact  Motor:  The patient has normal bulk and tone, no pronator drift and 5/5 strength bilaterally.  There are no adventitious movements.  Reflexes:  Are 3+ in RUE, 2+ LUE, 2+ at knees, absent at ankles.  Toes down.  Coordination:  Normal finger to nose.  No dysdiadokinesia.  Sensation is intact to touch.  Gait and Station are normal.  Tandem gait is intact.  Romberg is negative.  MRI Brain was reviewed and significant for chronic white matter disease of moderate extent.  There is also an abnormal signal intensity on FLAIR on the left side of the pons of uncertain significance.   Impression: Probable focal seizures.  In this age category the most common causes are ischemic stroke, intracranial mass and dementia.  Certainly there is no new infarction, although these can be microscopic.  Recommendations: I am going to get an MRI of her brain with contrast given her  history of cancer.  I am also going to get a routine EEG.  If these are normal I would still advocate starting the patient on an anti-convulsant likely levetiracetam or oxcarbazepine.   We will see the patient back in 6 weeks after her studies.  If they wish to start a medication earlier, they can feel free to call me.  Thank you for having [place] see [name] in consultation.  Feel free to contact me with any questions.  Lupita Raider Modesto Charon, MD [facility], [place] [address] [place], [place] [address] Phone: [phone] Fax: [phone].

## 2011-09-09 NOTE — Patient Instructions (Signed)
Go to the basement to have your lab work today.   Your MRI has been scheduled for Tuesday, October 9th at 2:00pm. Please arrive it [name] by 1:45pm. Your EEG is scheduled for the same day at 3:30pm.  Your follow up appointment with Dr. Modesto Charon is on Wednesday, November 21st at 11:00am.

## 2011-09-16 ENCOUNTER — Ambulatory Visit (HOSPITAL_COMMUNITY)
Admission: RE | Admit: 2011-09-16 | Discharge: 2011-09-16 | Disposition: A | Discharge status: Home / Self Care | Payer: Medicare Other | Source: Ambulatory Visit | Attending: Neurology | Admitting: Neurology

## 2011-09-16 ENCOUNTER — Other Ambulatory Visit: Payer: Self-pay

## 2011-09-16 DIAGNOSIS — R569 Unspecified convulsions: Secondary | ICD-10-CM

## 2011-09-16 MED ORDER — GADOBENATE DIMEGLUMINE 529 MG/ML IV SOLN
15.0000 mL | Freq: Once | INTRAVENOUS | Status: AC
  Administered 2011-09-16: 15 mL via INTRAVENOUS

## 2011-09-19 ENCOUNTER — Telehealth: Payer: Self-pay

## 2011-09-19 NOTE — Telephone Encounter (Signed)
Pt calling for results of her MRI and EEG.  [phone].

## 2011-09-22 NOTE — Procedures (Signed)
EEG NUMBER:  [id number].  This routine EEG was requested in this 76 year old female who is having spells are concerning for seizures.  Purpose of this EEG is to determine if there is evidence of epileptiform discharges.  The EEG was done with the patient awake.  During periods of maximal wakefulness, background activities were composed mainly of low amplitude, alpha and beta activities that were symmetric.  No obvious posterior dominant rhythm could be seen.  Throughout the tracing, there was rhythmic 3 cycle per second delta activities that were best seen over the right hemisphere.  These waxed and waned, and were relatively invariant in frequency.  Photic stimulation produced a minimal symmetric driving response. Hyperventilation did result in the emergence of slower symmetric activities. The patient did not sleep.  CLINICAL INTERPRETATION:  This routine EEG done with the patient awake is essentially normal.  The absence of an alpha rhythm can be seen in someone who is anxious.  The rhythmic 3 cycle per second delta activity seen over the right hemisphere is of uncertain significance.  It is felt most likely that these represent technical artifact, less likely would these be consistent with an underlying structural or functional lesion over the right hemisphere.          ______________________________ Denton Meek, MD    GN:FAOZ D:  09/18/2011 17:20:35  T:  09/19/2011 01:26:31  Job #:  [id number]

## 2011-09-22 NOTE — Telephone Encounter (Signed)
reviewed results of MRI and EEG.  patient will followup in several months, will let me know if she has another sz, but is electing not to treat for now.

## 2011-10-08 ENCOUNTER — Telehealth: Payer: Self-pay | Admitting: Neurology

## 2011-10-08 MED ORDER — LEVETIRACETAM 500 MG PO TABS: 1000.0000 mg | ORAL_TABLET | Freq: Two times a day (BID) | ORAL | Status: DC

## 2011-10-08 NOTE — Telephone Encounter (Signed)
patient had two focal seizures.  have e-scripted in keppra 500 bid, and she will take 1000mg  as soon as she gets it and then start bid dosing.

## 2011-10-08 NOTE — Telephone Encounter (Signed)
Pt had another "episode" and wanted to keep you informed. Please call husband if you would like to speak with them.

## 2011-10-29 ENCOUNTER — Ambulatory Visit: Payer: Medicare Other | Admitting: Neurology

## 2011-11-03 ENCOUNTER — Telehealth: Payer: Self-pay | Admitting: Neurology

## 2011-11-03 NOTE — Telephone Encounter (Signed)
Called pt. Have ticket open with [employer] to remove charge from account.

## 2011-11-03 NOTE — Telephone Encounter (Signed)
Pt's husband called because they received a notice that they no showed our fu appt on 10/29/11. According to Mr. Haack, you told them via a telephone call that they didn't need to come back in as long as the meds were working and pt was not having any more seizures. Appt was never removed from schedule. Should the pt have received the notice/charge for no showing the appt?

## 2011-11-03 NOTE — Telephone Encounter (Signed)
Ooops, my mistake.  Mr. Prest was right.  Give him my apologies and of course don't charge them the no show fee.

## 2011-11-20 ENCOUNTER — Telehealth: Payer: Self-pay

## 2011-11-20 NOTE — Telephone Encounter (Signed)
Pt would like to know if she can cut back on the Keppra some.  She said she just feels a little foggy when taking it.  She is currently taking 500mg  bid.  She said the tablets are scored so she could break in half.

## 2011-11-20 NOTE — Telephone Encounter (Signed)
Spoke to pt.  Have asked her to try 1/2 tab in the a.m., 1 at night.

## 2011-11-27 ENCOUNTER — Telehealth: Payer: Self-pay | Admitting: Neurology

## 2011-11-27 NOTE — Telephone Encounter (Signed)
Pt had previously reduced her Keppra by half a tab per Dr.'s instructions. That does worked for a couple of weeks but according to pt's husband, pt suffered "episode" this morning (11/27/2011). She has now gone back to taking her full dose, the original amount prescribed. Please advise whether or not this is okay. No details given about type of "episode."

## 2011-11-27 NOTE — Telephone Encounter (Signed)
Called and spoke with the patient as well as her husband. States she had an "episode" this morning at about 11 am. Her husband reports that he noticed her hand moving in an unusual way. He called her name and she turned her head to look at him and he noted facial twitching on the left side of her face. She would not answer to him. Episode lasted approximately one minute. He states she felt "foggy" for about 30 minutes afterwards. Back to baseline currently. I advised that they go back to taking the Keppra as Dr. Modesto Charon had originally ordered it. (500 mg one BID). She had been taking 1/2 in the am and 1 in the pm as of 12/13 secondary to feeling like she was in a fog. I told them both that I would let Dr. Modesto Charon know of the episode and that if there was anything additional to report I would get back with them. They were ok with this plan and will begin the one tab BID dosing. **Dr. Modesto Charon, please advise any further action.

## 2011-11-27 NOTE — Telephone Encounter (Signed)
That is ok, but am not sure she will tolerate the higher dose.  If not I would start Trileptal.

## 2011-11-28 NOTE — Telephone Encounter (Signed)
Called and spoke with the patient's husband. Information given as directed by Dr. Modesto Charon. Advised to call if she was unable to tolerate dose increase. [name] states he will. No other issues.

## 2011-12-04 ENCOUNTER — Other Ambulatory Visit: Payer: Self-pay

## 2011-12-04 MED ORDER — LEVETIRACETAM 500 MG PO TABS: 1000.0000 mg | ORAL_TABLET | Freq: Two times a day (BID) | ORAL | Status: DC

## 2012-01-20 ENCOUNTER — Telehealth: Payer: Self-pay | Admitting: Neurology

## 2012-01-20 NOTE — Telephone Encounter (Signed)
Called and spoke with the patient's husband. He states his wife had a small seizure yesterday. Was sitting on the couch and "she got the look.Marland KitchenMarland KitchenNo expression; absent stare." He called her name but she didn't answer at first but did eventually turn her head toward him and say "what." He reports that the entire episode lasted for about 10-15 minutes until she was back to her normal self. No shaking and no convulsions; no incontinence; typical for her. She is currently on Keppra 500 mg BID. Her last episode was November 27, 2011 and was almost identical to this one. Mr. Burdell wonders if Dr. Modesto Charon needs to increase her Keppra. Also, he is concerned about her walking in the neighborhood alone and having an episode. I told him she needed a walking partner but he states he can't keep up with her. He says "she either needs a walking partner or a medication  increase." I told him I would let Dr. Modesto Charon know what was going on and we would be in touch. He is in agreement with this plan. **Dr. Modesto Charon, please advise.

## 2012-01-20 NOTE — Telephone Encounter (Signed)
Pt's husband states that pt had a mild seizure from which she recovered quickly. She is currently taking 500mg  Keppra BID. Should she increase this dose slightly? He is concerned about her ability to walk around the neighborhood alone. Please call and advise.

## 2012-01-20 NOTE — Telephone Encounter (Signed)
Spoke to Dr. Adline Potter - ok'd Henessy going to 750/500 of keppra.  If fatigue and irritability get too bad I will likely start her on LTG.

## 2012-01-26 ENCOUNTER — Telehealth: Payer: Self-pay | Admitting: Neurology

## 2012-01-26 NOTE — Telephone Encounter (Signed)
Pt's husband called with a couple of questions, but was unwilling to give any detail. He asked to speak with Dr. Modesto Charon at his convenience. Please call him back at his cell phone number in order to gather more details. He stated that the situation was not urgent.

## 2012-01-26 NOTE — Telephone Encounter (Signed)
Called and spoke with the patient's husband. He wanted to know if Dr. Modesto Charon thinks it would be a good idea to have a second, independent person (radiologist) take a look at her most recent imaging studies...MRI and CT. He is concerned that perhaps something may have been missed and he wants to be sure that isn't the case. He states that he wants Dr. Nash Dimmer opinion on this request. He reports that he does believe there is a small discrepancy on the MRI. I told him that Dr. Modesto Charon was out of the office for today but that I would send him a message and that someone would be in touch tomorrow. Mr. Fuerstenberg agreed with this plan. **Dr. Modesto Charon, please advise "second look/review" of imaging studies.

## 2012-01-28 NOTE — Telephone Encounter (Signed)
Tell him more eyes is always better than less.  I don't think between our radiologist and myself looking that we missed anything significant, but would always welcome another opinion.

## 2012-01-29 NOTE — Telephone Encounter (Signed)
Called and spoke with the patient's husband. Info given as per Dr. Modesto Charon below. Mr. Facemire satisfied with Dr. Nash Dimmer response. No additional concerns voiced and will not pursue a second opinion at this time.

## 2012-08-04 ENCOUNTER — Telehealth: Payer: Self-pay | Admitting: Internal Medicine

## 2012-08-04 ENCOUNTER — Encounter: Payer: Self-pay | Admitting: Internal Medicine

## 2012-08-04 NOTE — Telephone Encounter (Signed)
Recall Project: no contact with patient, letter mailed °

## 2012-08-05 ENCOUNTER — Encounter: Payer: Self-pay | Admitting: Neurology

## 2012-08-18 ENCOUNTER — Encounter: Payer: Self-pay | Admitting: Neurology

## 2012-08-18 ENCOUNTER — Ambulatory Visit (INDEPENDENT_AMBULATORY_CARE_PROVIDER_SITE_OTHER): Payer: Medicare Other | Admitting: Neurology

## 2012-08-18 VITALS — BP 200/88 | HR 72 | Wt 185.0 lb

## 2012-08-18 DIAGNOSIS — R569 Unspecified convulsions: Secondary | ICD-10-CM

## 2012-08-18 MED ORDER — LEVETIRACETAM 500 MG PO TABS: ORAL_TABLET | ORAL | Status: DC

## 2012-08-18 NOTE — Progress Notes (Signed)
   Dear Dr. Drue Novel,  I saw  [name] back in [facility] for her problem with focal epilepsy of unknown etiology.  As you may recall, she is a 77 y.o. year old female with a history of hypertension and hyperlipidemia whose MRI revealed global atrophy with atrophied hippocampi, but no clear atrophy out of keeping with global atrophy.  In retrospect, EEG was suspicious for right hemispheric rhythmic delta that was TIRDA like.  Her first seizures was in 8/1.  2 nocturnal on 9/1, one during wakefulness on 10/1, 2 on 10/31, and then started Keppra 500 bid on that day.  Because of fatigue she self weaned her Keppra to 250/500.  Unfortunately she had another seizure on that dose.  She increased it to 500 bid and then had another seizure.  She self increased to 500/750 and then dropped it back to 500 bid.  She has not had a seizure since approximately February when she increased to 500/750 and then reduced her dose.  She is not having any major problems with the medication otherwise.    Medical history, social history, and family history were reviewed and have not changed since the last clinic visit.  Current Outpatient Prescriptions on File Prior to Visit  Medication Sig Dispense Refill  . aspirin 325 MG tablet Take 325 mg by mouth daily.        Marland Kitchen DISCONTD: levETIRAcetam (KEPPRA) 500 MG tablet Take 2 tablets (1,000 mg total) by mouth 2 (two) times daily.  360 tablet  3  . atorvastatin (LIPITOR) 40 MG tablet Take 1 tablet (40 mg total) by mouth daily.  30 tablet  3  . benazepril-hydrochlorthiazide (LOTENSIN HCT) 20-12.5 MG per tablet Take 1 tablet by mouth daily.         No Known Allergies  ROS:  13 systems were reviewed and  are unremarkable.  Exam: . Filed Vitals:   08/18/12 0940  BP: 200/88  Pulse: 72  Weight: 185 lb (83.915 kg)    In general, well appearing women.  MMSE 27/27  Cranial Nerves: Pupils are equally round and reactive to light. Visual fields full to  confrontation. Extraocular movements are intact without nystagmus.  Muscles of facial expression are symmetric.  Tongue protrusion, uvula, palate midline.  Shoulder shrug intact  Motor:  Normal bulk and tone, no drift and 5/5 muscle strength bilaterally.  Reflexes:  2+ thoughout, toes down.  Coordination:  Normal finger to nose  Gait:  Normal gait and station.  Romberg negative.  Impression/Recommendations:  1.  Focal epilepsy - increase Keppra to 500/750. 2.  Memory disorder - I don't see a major issue here at this time.  In the future we may consider memory testing or prolonged eeg monitoring to look for subclinical seizures.  I will see her at [facility] in about 4 months.   Lupita Raider Modesto Charon, MD [facility], [place]

## 2012-08-26 ENCOUNTER — Ambulatory Visit (INDEPENDENT_AMBULATORY_CARE_PROVIDER_SITE_OTHER): Payer: Medicare Other | Admitting: Internal Medicine

## 2012-08-26 ENCOUNTER — Encounter: Payer: Self-pay | Admitting: Internal Medicine

## 2012-08-26 VITALS — BP 144/84 | HR 69 | Temp 97.7°F | Wt 183.0 lb

## 2012-08-26 DIAGNOSIS — E785 Hyperlipidemia, unspecified: Secondary | ICD-10-CM

## 2012-08-26 DIAGNOSIS — I10 Essential (primary) hypertension: Secondary | ICD-10-CM

## 2012-08-26 LAB — LDL CHOLESTEROL, DIRECT: Direct LDL: 137.1 mg/dL

## 2012-08-26 LAB — COMPREHENSIVE METABOLIC PANEL
ALT: 18 U/L (ref 0–35)
AST: 22 U/L (ref 0–37)
Albumin: 4.2 g/dL (ref 3.5–5.2)
CO2: 25 mEq/L (ref 19–32)
Calcium: 8.9 mg/dL (ref 8.4–10.5)
Chloride: 102 mEq/L (ref 96–112)
Potassium: 3.8 mEq/L (ref 3.5–5.1)

## 2012-08-26 MED ORDER — BENAZEPRIL-HYDROCHLOROTHIAZIDE 5-6.25 MG PO TABS: 1.0000 | ORAL_TABLET | Freq: Every day | ORAL | Status: DC

## 2012-08-26 NOTE — Assessment & Plan Note (Addendum)
Stopped Lotensin HCT 20-12.5. BP went up to 200 when she was at neurology. She went back on the medication  , BP went down  to 111/68. The next day BP was 106/65 so she decided not to take the medication. She kept checking her BP and it has been as high as 140/64 I think she needs BP medication, just at a lower dose. Patient in agreement. Plan: Restart Lotensin HCT 5/6.25, monitor BP closely, may need to increase dose to 2 tablets daily. See instructions.

## 2012-08-26 NOTE — Progress Notes (Signed)
  Subjective:    Patient ID: [name], female    DOB: Oct 31, 1935, 77 y.o.   MRN: [id number]  HPI Here for eval of hypertension She self discontinue her BP medication a while back, BP at a neurology visit few days ago was a high. See assessment and plan. She also discontinue Lipitor, reports no side effects, she simply stopped taking it.   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 Seizures well controlled on Keppra, last seizure  01/2012    Objective:   Physical Exam General -- alert, well-developed. No apparent distress.  Lungs -- normal respiratory effort, no intercostal retractions, no accessory muscle use, and normal breath sounds.   Heart-- normal rate, regular rhythm, no murmur, and no gallop.   Extremities-- no pretibial edema bilaterally  psych-- Cognition and judgment appear intact. Alert and cooperative with normal attention span and concentration.  not anxious appearing and not depressed appearing.        Assessment & Plan:   We talk about the flu shot, she likes a  "high dose shot", not available at this office. Will talk with her pharmacist

## 2012-08-26 NOTE — Assessment & Plan Note (Signed)
Off Lipitor for a while, no side effects she just stopped taking it. Labs

## 2012-08-26 NOTE — Patient Instructions (Addendum)
Taken blood pressure medication every day Check the  blood pressure 2 or 3 times a week, be sure it is between 110/60 and 135/85. If it is consistently higher or lower, let me know Please come back in 6 weeks, fasting for a   yearly checkup

## 2012-11-19 ENCOUNTER — Ambulatory Visit (INDEPENDENT_AMBULATORY_CARE_PROVIDER_SITE_OTHER): Payer: Medicare Other | Admitting: Internal Medicine

## 2012-11-19 ENCOUNTER — Encounter: Payer: Self-pay | Admitting: Internal Medicine

## 2012-11-19 VITALS — BP 124/80 | HR 71 | Temp 97.4°F | Resp 12 | Wt 180.0 lb

## 2012-11-19 DIAGNOSIS — G40209 Localization-related (focal) (partial) symptomatic epilepsy and epileptic syndromes with complex partial seizures, not intractable, without status epilepticus: Secondary | ICD-10-CM

## 2012-11-19 DIAGNOSIS — W19XXXA Unspecified fall, initial encounter: Secondary | ICD-10-CM

## 2012-11-19 NOTE — Patient Instructions (Addendum)
Review and correct the record as indicated. Please share record with all medical staff seen. Resume the Keppra dose recommended by Dr Modesto Charon. As per the Standard of Care , screening Colonoscopy recommended once cleared by Dr Modesto Charon

## 2012-11-19 NOTE — Progress Notes (Signed)
  Subjective:    Patient ID: [name], female    DOB: 06-22-1935, 77 y.o.   MRN: [id number]  HPI  Obviously she fell 11/17/12 while walking alone based on abrasions of her palms and knees noted by her husband upon her return home. He was concerned about possible bodily injury from the fall.  She has a history of partial complex seizures for which Dr. Modesto Charon  had prescribed Keppra 1250 mg daily. Because she felt well she decreased the dose to 1000 mg approximately one month ago. She states that she's had difficulty accepting that she has "seizures". The phenomena is of an  Absence clinical picture without other seizure stigmata As of yesterday she's increased the dose to 1500 mg she plans to followup with Dr. Modesto Charon at [facility].    Review of Systems She does not remember the event but does not describe any cardiac or neurologic prodrome prior to the fall. Specifically she denies chest pain palpitations or cardiac rhythm disturbance. She denies any headache, limb weakness, numbness or tingling prior to the event. She had pain in her wrist at the fall for which she's taken aspirin.  She's been notified that she is due for surveillance colonoscopy. She denies abdominal pain, unexplained weight loss, melena, or rectal bleeding.     Objective:   Physical Exam   Gen. appearance: Well-nourished, in no distress. Appears younger than stated age  Eyes: Extraocular motion intact, field of vision normal, vision grossly intact, no nystagmus ENT:  hearing aids bilaterally Neck: Normal range of motion, no masses Cardiovascular: Rate and rhythm normal; no gallops or extra heart sounds.Grade 1/6 systolic murmur @ base Muscle skeletal: Range of motion, tone, &  strength normal. She has soft tissue swelling over the dorsal lateral right forearm with ecchymosis. There is no pain with palpation or light percussion over this area. Range of motion of the wrist appears to be normal. No effusions are noted of the  knees. She is able to lie flat and sit up  with minimal aid Neuro:no cranial nerve deficit, deep tendon  reflexes normal, gait normal. Romberg testing is negative. She has mild tremor bilaterally with finger-nose testing. She knows the month and the year. She is unable to give me the day of the week or the President's name. Lymph: No cervical or axillary LA Skin: Warm and dry without suspicious lesions or rashes. Small abrasions are noted over the left knee. Psych: no anxiety or mood change. Normally interactive and cooperative.          Assessment & Plan:  #1 fall without apparent significant musculoskeletal injury. Resolving abrasions and ecchymosis  #2 partial complex seizures being treated by Dr. Modesto Charon  #3 some noncompliance with medication due to denial of #2  Plan: See orders and recommendations

## 2012-12-28 ENCOUNTER — Ambulatory Visit (INDEPENDENT_AMBULATORY_CARE_PROVIDER_SITE_OTHER): Payer: Medicare Other | Admitting: Internal Medicine

## 2012-12-28 VITALS — BP 138/82 | HR 69 | Temp 97.8°F | Wt 182.0 lb

## 2012-12-28 DIAGNOSIS — R011 Cardiac murmur, unspecified: Secondary | ICD-10-CM

## 2012-12-28 DIAGNOSIS — I10 Essential (primary) hypertension: Secondary | ICD-10-CM

## 2012-12-28 DIAGNOSIS — I350 Nonrheumatic aortic (valve) stenosis: Secondary | ICD-10-CM | POA: Insufficient documentation

## 2012-12-28 DIAGNOSIS — R569 Unspecified convulsions: Secondary | ICD-10-CM

## 2012-12-28 DIAGNOSIS — E785 Hyperlipidemia, unspecified: Secondary | ICD-10-CM

## 2012-12-28 DIAGNOSIS — K921 Melena: Secondary | ICD-10-CM | POA: Insufficient documentation

## 2012-12-28 LAB — CBC WITH DIFFERENTIAL/PLATELET
Basophils Relative: 0.3 % (ref 0.0–3.0)
Eosinophils Relative: 0.8 % (ref 0.0–5.0)
HCT: 43.5 % (ref 36.0–46.0)
Hemoglobin: 14.9 g/dL (ref 12.0–15.0)
Lymphs Abs: 1.1 10*3/uL (ref 0.7–4.0)
Monocytes Relative: 8.5 % (ref 3.0–12.0)
Neutro Abs: 5.7 10*3/uL (ref 1.4–7.7)
RBC: 4.99 Mil/uL (ref 3.87–5.11)
WBC: 7.6 10*3/uL (ref 4.5–10.5)

## 2012-12-28 LAB — IRON: Iron: 122 ug/dL (ref 42–145)

## 2012-12-28 MED ORDER — ATORVASTATIN CALCIUM 40 MG PO TABS: 40.0000 mg | ORAL_TABLET | Freq: Every day | ORAL | Status: DC

## 2012-12-28 NOTE — Assessment & Plan Note (Signed)
Today, she reports that she likes to try statins again based on a report she read on the Internet. I'm not opposed. Restart Lipitor.

## 2012-12-28 NOTE — Assessment & Plan Note (Signed)
Reports a change in the color of his stools 2 weeks ago, otherwise GI review of systems is negative. Description of the event is questionable for actual bleeding. Last colonoscopy in 2006, had hperplastic  polyps,next Cscope per Dr Marina Goodell. Plan: IFOC, CBC, iron, ferritin. Consider GI referral GI referral if further change in the stool color

## 2012-12-28 NOTE — Assessment & Plan Note (Signed)
No evidence, so neurology, currently on Keppra 1500 mg daily. Reports that now she does believe she has a seizure problem based on recent fall, see previous office visit.

## 2012-12-28 NOTE — Progress Notes (Signed)
  Subjective:    Patient ID: [name], female    DOB: Oct 14, 1935, 78 y.o.   MRN: [id number]  HPI We discussed the following issues today: Seizure disorder, was recently seen after a fall felt to be due to an absence sz, saw neurology, back on Keppra and feeling well. High cholesterol, today the patient reports that she would like to go back on Lipitor. See instructions. Hypertension, reports that she's not taking her BP medication consistently because her BP ranged from a systolic of 99 to 120s. She feels that it is too low sometimes although when she visited neurology it was as high as 200. Finally, 2 weeks ago her stools were red in color. There was no actual blood, the stools were not black or tarry, there was no mucus. It was a one-time event.  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 Not taking any NSAIDs    Objective:   Physical Exam  General -- alert, well-developed HEENT -- not pale or jaundice  Lungs -- normal respiratory effort, no intercostal retractions, no accessory muscle use, and normal breath sounds.   Heart--  systolic murmur, II/VI, best heard at the right side of the sternum Abdomen--soft, non-tender, no distention Extremities-- no pretibial edema bilaterally  Psych-- Cognition and judgment appear intact. Alert and  cooperative with normal attention span and concentration.  not anxious appearing and not depressed appearing.       Assessment & Plan:   Today , I spent more than 25  min with the patient, >50% of the time counseling and answering all her questions to the best of my ability

## 2012-12-28 NOTE — Assessment & Plan Note (Addendum)
Heart murmur felt today, he has been previously described. Reports that she had a heart murmur "all her life". Had a echocardiogram 8- 2012, see report below. Plan: Reassess at the next visit, consider redo a echocardiogram - Left ventricle: The cavity size was normal. Wall thickness was normal. Systolic function was vigorous. The estimated ejection fraction was in the range of 65% to 70%. Wall motion was normal; there were no regional wall motion abnormalities. Doppler parameters are consistent with abnormal left ventricular relaxation (grade 1 diastolic dysfunction). - Aortic valve: There was mild stenosis. Moderate regurgitation. - Atrial septum: No defect or patent foramen ovale was identified. - Pulmonary arteries: PA peak pressure: 35mm Hg (S).

## 2012-12-28 NOTE — Assessment & Plan Note (Addendum)
Not taking low dose of BP medications consistently because her BP is sometimes " normal or in the low side". She is feeling well. We had a long discussion about the importance of taking medication, she agreed that she will take medication daily. See instructions

## 2012-12-28 NOTE — Patient Instructions (Addendum)
Check the  blood pressure 2 or 3 times a week, be sure it is between 110/60 and 140/85. If it is consistently higher or lower, let me know Next visit in 2 months, fasting

## 2013-01-23 ENCOUNTER — Other Ambulatory Visit: Payer: Self-pay

## 2013-07-14 ENCOUNTER — Ambulatory Visit (INDEPENDENT_AMBULATORY_CARE_PROVIDER_SITE_OTHER): Payer: Medicare Other | Admitting: Internal Medicine

## 2013-07-14 ENCOUNTER — Telehealth: Payer: Self-pay | Admitting: *Deleted

## 2013-07-14 VITALS — BP 190/90 | HR 85 | Temp 97.8°F | Wt 178.4 lb

## 2013-07-14 DIAGNOSIS — R220 Localized swelling, mass and lump, head: Secondary | ICD-10-CM

## 2013-07-14 DIAGNOSIS — R221 Localized swelling, mass and lump, neck: Secondary | ICD-10-CM

## 2013-07-14 DIAGNOSIS — E785 Hyperlipidemia, unspecified: Secondary | ICD-10-CM

## 2013-07-14 DIAGNOSIS — R011 Cardiac murmur, unspecified: Secondary | ICD-10-CM

## 2013-07-14 DIAGNOSIS — I10 Essential (primary) hypertension: Secondary | ICD-10-CM

## 2013-07-14 NOTE — Telephone Encounter (Signed)
Orders changed per protocol.

## 2013-07-14 NOTE — Assessment & Plan Note (Addendum)
Compliance, see history of present illness.Strongly encouraged to take her medication every day, BP elevated today. Plan: Return to the office in 2 weeks for a routine office visit, BP check and labs

## 2013-07-14 NOTE — Telephone Encounter (Signed)
Message copied by Shirlee More I on Thu Jul 14, 2013 11:22 AM ------      Message from: Donzetta Kohut B      Created: Thu Jul 14, 2013 11:13 AM       There is availability for Carotid tomorrow, but the Carotid order in put in as an U/S, and they will not accept that.  Will you please remove current Carotid and re-enter as Vascular Bilateral Carotid?            Thank you             ----- Message -----         From: Wanda Plump, MD         Sent: 07/14/2013  11:09 AM           To: Duaine Dredge            Could we do the carotid ultrasound and echocardiogram within few days?       ------

## 2013-07-14 NOTE — Assessment & Plan Note (Addendum)
Decided not to take Lipitor, will do labs in 2 weeks

## 2013-07-14 NOTE — Assessment & Plan Note (Signed)
Nontender pulsatile mass, it could be redundant carotid artery versus aneurysm. We'll get a carotid ultrasound. Patient is asymptomatic.

## 2013-07-14 NOTE — Patient Instructions (Signed)
Come back fasting in 2 weeks, please make an appointment. Check the  blood pressure 2 or 3 times a week, be sure it is between 110/60 and 140/85. If it is consistently higher or lower, let me know

## 2013-07-14 NOTE — Assessment & Plan Note (Signed)
Murmur more noticeable today than on previous visits. Plan: Echocardiogram Cardiology referral

## 2013-07-14 NOTE — Progress Notes (Signed)
  Subjective:    Patient ID: [name], female    DOB: 06-21-1935, 78 y.o.   MRN: [id number]  HPI Acute visit, Here with her husband Burgess Estelle, the patient was exercising at the [facility], her husband noted a "big pulse" on the right side of the neck for the first time. The patient increase his exercise activities lately and is feeling well. Hypertension, is taking BP medication only as needed because often times she has good ambulatory BPs. high cholesterol, decided not to take Lipitor.  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 No nausea, vomiting, diarrhea. No syncope, dizziness, slurred speech or facial paresthesias.     Objective:   Physical Exam  Neck:     BP 190/90  Pulse 85  Temp(Src) 97.8 F (36.6 C) (Oral)  Wt 178 lb 6.4 oz (80.922 kg)  BMI 28.81 kg/m2  SpO2 97% General -- alert, well-developed, NAD   Neck --no thyromegaly , see graphic  Lungs -- normal respiratory effort, no intercostal retractions, no accessory muscle use, and normal breath sounds.   Heart-- at least III/VI. Systolic murmur more noticeable on the right side of the sternum. Extremities-- no pretibial edema bilaterally Neurologic-- alert & oriented X3 ; Speech gait and motor are normal Psych-- Cognition and judgment appear intact. Alert and cooperative with normal attention span and  concentration.  + anxious appearing and not depressed appearing.        Assessment & Plan:

## 2013-07-14 NOTE — Telephone Encounter (Deleted)
Orders entered and prior orders d/c per protocol.

## 2013-07-15 ENCOUNTER — Encounter (INDEPENDENT_AMBULATORY_CARE_PROVIDER_SITE_OTHER): Payer: Medicare Other

## 2013-07-15 DIAGNOSIS — R221 Localized swelling, mass and lump, neck: Secondary | ICD-10-CM

## 2013-07-15 DIAGNOSIS — I6529 Occlusion and stenosis of unspecified carotid artery: Secondary | ICD-10-CM

## 2013-07-17 ENCOUNTER — Encounter: Payer: Self-pay | Admitting: Internal Medicine

## 2013-07-17 ENCOUNTER — Telehealth: Payer: Self-pay | Admitting: Internal Medicine

## 2013-07-17 NOTE — Telephone Encounter (Signed)
Advise pt: carotid u/s showed a large carotid artery but no aneurysm or blockage, no further tests for evaluation of the neck are needed at this time

## 2013-07-18 NOTE — Telephone Encounter (Signed)
Discussed with patient, verbalized understanding.  

## 2013-07-19 ENCOUNTER — Ambulatory Visit (HOSPITAL_COMMUNITY): Payer: Medicare Other | Attending: Cardiology | Admitting: Radiology

## 2013-07-19 DIAGNOSIS — C50919 Malignant neoplasm of unspecified site of unspecified female breast: Secondary | ICD-10-CM | POA: Insufficient documentation

## 2013-07-19 DIAGNOSIS — E785 Hyperlipidemia, unspecified: Secondary | ICD-10-CM | POA: Insufficient documentation

## 2013-07-19 DIAGNOSIS — R011 Cardiac murmur, unspecified: Secondary | ICD-10-CM

## 2013-07-19 DIAGNOSIS — I10 Essential (primary) hypertension: Secondary | ICD-10-CM | POA: Insufficient documentation

## 2013-07-19 DIAGNOSIS — I359 Nonrheumatic aortic valve disorder, unspecified: Secondary | ICD-10-CM

## 2013-07-19 NOTE — Progress Notes (Signed)
Echocardiogram performed.  

## 2013-07-28 ENCOUNTER — Ambulatory Visit (INDEPENDENT_AMBULATORY_CARE_PROVIDER_SITE_OTHER): Payer: Medicare Other | Admitting: Internal Medicine

## 2013-07-28 ENCOUNTER — Encounter: Payer: Self-pay | Admitting: Internal Medicine

## 2013-07-28 VITALS — BP 170/90 | HR 85 | Temp 97.8°F | Wt 174.8 lb

## 2013-07-28 DIAGNOSIS — R011 Cardiac murmur, unspecified: Secondary | ICD-10-CM

## 2013-07-28 DIAGNOSIS — R221 Localized swelling, mass and lump, neck: Secondary | ICD-10-CM

## 2013-07-28 DIAGNOSIS — R220 Localized swelling, mass and lump, head: Secondary | ICD-10-CM

## 2013-07-28 DIAGNOSIS — I10 Essential (primary) hypertension: Secondary | ICD-10-CM

## 2013-07-28 DIAGNOSIS — E785 Hyperlipidemia, unspecified: Secondary | ICD-10-CM

## 2013-07-28 LAB — COMPREHENSIVE METABOLIC PANEL
ALT: 16 U/L (ref 0–35)
Alkaline Phosphatase: 84 U/L (ref 39–117)
BUN: 12 mg/dL (ref 6–23)
Chloride: 101 mEq/L (ref 96–112)
GFR: 58.63 mL/min — ABNORMAL LOW (ref >60.00)
Potassium: 3.9 mEq/L (ref 3.5–5.1)
Total Protein: 7.9 g/dL (ref 6.0–8.3)

## 2013-07-28 LAB — LIPID PANEL
Cholesterol: 262 mg/dL — ABNORMAL HIGH (ref 0–200)
HDL: 72.3 mg/dL (ref >39.00)
Total CHOL/HDL Ratio: 4
VLDL: 27.2 mg/dL (ref 0.0–40.0)

## 2013-07-28 MED ORDER — BENAZEPRIL-HYDROCHLOROTHIAZIDE 5-6.25 MG PO TABS: 1.0000 | ORAL_TABLET | Freq: Every day | ORAL | Status: DC

## 2013-07-28 NOTE — Assessment & Plan Note (Signed)
Carotid ultrasound showed: RCCA tortuous, right side 1-39% stenosis. Plan:  good results, control CV RF

## 2013-07-28 NOTE — Progress Notes (Signed)
  Subjective:    Patient ID: [name], female    DOB: 08-Feb-1935, 78 y.o.   MRN: [id number]  HPI Followup visit. We reviewed the results of her echocardiogram and carotid ultrasound which were good. Good compliance with BP meds, ambulatory BPs 120/60, 114/65 this morning.  Past Medical History  Diagnosis Date  . 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 HTN (hypertension)   . Hyperlipidemia   . PVD (peripheral vascular disease)     saw Dr Samule Ohm 2007, severe but Asx R Common Iliac artery stenosis, u/s showed no AAA  . Uterine cancer 90s  . Breast CA     h/o , bilateral   Past Surgical History  Procedure Laterality Date  . Mastectomy  2007    bilateral  . Total abdominal hysterectomy  90    abdominal ? vaginal?      Review of Systems Diet is healthy Exercise--limited, does some walking. No chest pain shortness of breath No nausea, vomiting, diarrhea. No cough.     Objective:   Physical Exam BP 170/90  Pulse 85  Temp(Src) 97.8 F (36.6 C)  Wt 174 lb 12.8 oz (79.289 kg)  BMI 28.23 kg/m2  SpO2 96%  General -- alert, well-developed, NAD.  Lungs -- normal respiratory effort, no intercostal retractions, no accessory muscle use, and normal breath sounds.  Heart-- normal rate, regular rhythm, no murmur.   Extremities-- no pretibial edema bilaterally   Psych-- Cognition and judgment appear intact. Alert and cooperative with normal attention span and concentration. not anxious appearing and not depressed appearing.      Assessment & Plan:

## 2013-07-28 NOTE — Assessment & Plan Note (Signed)
Good medication compliance, BP here elevated but ambulatory BPs w/ an arm cuff are very good. Plan: No change, continue monitoring her BP, labs.

## 2013-07-28 NOTE — Patient Instructions (Addendum)
Continue taking your BPs at home, goal   is between 110/60 and 140/80 Next visit in 5-6 months for a physical exam, fasting. Please make an appointment

## 2013-07-28 NOTE — Assessment & Plan Note (Signed)
Echocardiogram satisfactory, she does have aortic stenosis, mild. Cardiology eval pending

## 2013-08-01 ENCOUNTER — Encounter: Payer: Self-pay | Admitting: General Practice

## 2013-09-05 ENCOUNTER — Ambulatory Visit (INDEPENDENT_AMBULATORY_CARE_PROVIDER_SITE_OTHER): Payer: Medicare Other | Admitting: Cardiovascular Disease

## 2013-09-05 ENCOUNTER — Encounter: Payer: Self-pay | Admitting: Cardiovascular Disease

## 2013-09-05 VITALS — BP 140/82 | HR 72 | Ht 66.0 in | Wt 173.0 lb

## 2013-09-05 DIAGNOSIS — R221 Localized swelling, mass and lump, neck: Secondary | ICD-10-CM

## 2013-09-05 DIAGNOSIS — I10 Essential (primary) hypertension: Secondary | ICD-10-CM

## 2013-09-05 DIAGNOSIS — R220 Localized swelling, mass and lump, head: Secondary | ICD-10-CM

## 2013-09-05 DIAGNOSIS — I739 Peripheral vascular disease, unspecified: Secondary | ICD-10-CM

## 2013-09-05 DIAGNOSIS — R011 Cardiac murmur, unspecified: Secondary | ICD-10-CM

## 2013-09-05 DIAGNOSIS — E785 Hyperlipidemia, unspecified: Secondary | ICD-10-CM

## 2013-09-05 MED ORDER — ATORVASTATIN CALCIUM 10 MG PO TABS: 10.0000 mg | ORAL_TABLET | Freq: Every day | ORAL | Status: DC

## 2013-09-05 NOTE — Assessment & Plan Note (Signed)
Start lipitor 20 mg generic  F/U labs in 3 months

## 2013-09-05 NOTE — Assessment & Plan Note (Signed)
Stable with no claudication F/U ABI's in August

## 2013-09-05 NOTE — Assessment & Plan Note (Signed)
Tortuous right ICA with no aneurysm or stenosis Benign

## 2013-09-05 NOTE — Assessment & Plan Note (Signed)
Mild to moderate AS  F/U echo in August

## 2013-09-05 NOTE — Progress Notes (Signed)
Patient ID: [name], female   DOB: 08/17/1935, 78 y.o.   MRN: [id number] 78 yo referred by Dr Drue Novel for murmur.  She has known PVD seen by Dr Samule Ohm in past Not been closely followed. She is active though with no claudication.  Note from 2007 Indicated severe R iliac artery stenosis No ABI's seen Recommended conservative Rx due to lack of symptoms.  Has murmur with recent echo showing normal EF and mild to moderate AS.  LDL is 174 and should be on statin given known vascular disease and AS  No chest pain or known CAD  She has never had SSCP and is active     Study Conclusions  07/19/13  Echo   - Left ventricle: The cavity size was normal. Wall thickness was normal. Systolic function was vigorous. The estimated ejection fraction was in the range of 65% to 70%. Wall motion was normal; there were no regional wall motion abnormalities. Doppler parameters are consistent with abnormal left ventricular relaxation (grade 1 diastolic dysfunction). - Aortic valve: There was mild stenosis. Mild regurgitation. Mean gradient: 24mm Hg (S). Peak gradient: 43mm Hg (S). - Pulmonary arteries: PA peak pressure: 31mm Hg (S).  Carotid 1-39% bilateral disease  07/15/13  ROS: Denies fever, malais, weight loss, blurry vision, decreased visual acuity, cough, sputum, SOB, hemoptysis, pleuritic pain, palpitaitons, heartburn, abdominal pain, melena, lower extremity edema, claudication, or rash.  All other systems reviewed and negative   General: Affect appropriate Healthy:  appears stated age HEENT: normal Neck supple with no adenopathy prominent RICA from tortuosity JVP normal no bruits no thyromegaly Lungs clear with no wheezing and good diaphragmatic motion Heart:  S1/S2 AS  Murmur radiates to neck no ,rub, gallop or click PMI normal Abdomen: benighn, BS positve, no tenderness, no AAA no bruit.  No HSM or HJR Distal pulses intact with no bruits No edema Neuro non-focal Skin warm and dry No muscular  weakness  Medications Current Outpatient Prescriptions  Medication Sig Dispense Refill  . aspirin 325 MG tablet Take 325 mg by mouth daily.        . benazepril-hydrochlorthiazide (LOTENSIN HCT) 5-6.25 MG per tablet Take 1 tablet by mouth daily.  90 tablet  2  . lamoTRIgine (LAMICTAL) 25 MG tablet Take 25 mg by mouth. 5 TABS  BID      . levETIRAcetam (KEPPRA) 500 MG tablet take 1 tablet in the a.m., 1.5 in the p.m  225 tablet  3   No current facility-administered medications for this visit.    Allergies Review of patient's allergies indicates no known allergies.  Family History: No family history on file.  Social History: History   Social History  . Marital Status: Married    Spouse Name: N/A    Number of Children: N/A  . Years of Education: N/A   Occupational History  . Not on file.   Social History Main Topics  . Smoking status: Never Smoker   . Smokeless tobacco: Never Used  . Alcohol Use: Yes     Comment: 2 glasses of wine daily  . Drug Use: No  . Sexual Activity: Not on file   Other Topics Concern  . Not on file   Social History Narrative  . No narrative on file    Electrocardiogram:  SR rate 72  PAC  LAD LVH    Assessment and Plan

## 2013-09-05 NOTE — Assessment & Plan Note (Signed)
Well controlled.  Continue current medications and low sodium Dash type diet.    

## 2013-09-05 NOTE — Patient Instructions (Addendum)
Your physician wants you to follow-up in:   NEXT AUGUST  SEE DR Eden Emms  WITH  ECHO  AND   ABI'S  SAME DAY You will receive a reminder letter in the mail two months in advance. If you don't receive a letter, please call our office to schedule the follow-up appointment. Your physician has recommended you make the following change in your medication: START  ATORVASTATIN   10 MG   EVERY   EVENING Your physician recommends that you return for lab work in:  FASTING    LIPID LIVER  IN    3 MONTHS  AT  DR [facility] OFFICE

## 2013-09-27 ENCOUNTER — Telehealth: Payer: Self-pay | Admitting: *Deleted

## 2013-09-27 NOTE — Telephone Encounter (Signed)
Pt wanted to know if she could possibly have a capsule endoscopy done instead of a colonoscopy. Discussed with pt that those tests were not the same. Pt states she will get back with [place] and schedule her colon.

## 2013-10-13 ENCOUNTER — Other Ambulatory Visit: Payer: Self-pay

## 2014-09-22 ENCOUNTER — Other Ambulatory Visit: Payer: Self-pay

## 2014-10-05 ENCOUNTER — Encounter: Payer: Self-pay | Admitting: Internal Medicine

## 2014-10-05 ENCOUNTER — Ambulatory Visit (INDEPENDENT_AMBULATORY_CARE_PROVIDER_SITE_OTHER): Payer: Medicare Other | Admitting: Internal Medicine

## 2014-10-05 VITALS — BP 124/68 | HR 82 | Temp 98.2°F | Wt 177.4 lb

## 2014-10-05 DIAGNOSIS — Z23 Encounter for immunization: Secondary | ICD-10-CM

## 2014-10-05 DIAGNOSIS — R569 Unspecified convulsions: Secondary | ICD-10-CM

## 2014-10-05 DIAGNOSIS — I10 Essential (primary) hypertension: Secondary | ICD-10-CM

## 2014-10-05 DIAGNOSIS — Z0000 Encounter for general adult medical examination without abnormal findings: Secondary | ICD-10-CM | POA: Insufficient documentation

## 2014-10-05 DIAGNOSIS — E785 Hyperlipidemia, unspecified: Secondary | ICD-10-CM

## 2014-10-05 LAB — COMPREHENSIVE METABOLIC PANEL
ALT: 21 U/L (ref 0–35)
AST: 22 U/L (ref 0–37)
Albumin: 3.8 g/dL (ref 3.5–5.2)
Alkaline Phosphatase: 79 U/L (ref 39–117)
BUN: 13 mg/dL (ref 6–23)
CALCIUM: 9.2 mg/dL (ref 8.4–10.5)
CHLORIDE: 104 meq/L (ref 96–112)
CO2: 26 meq/L (ref 19–32)
CREATININE: 0.9 mg/dL (ref 0.4–1.2)
GFR: 63.66 mL/min (ref >60.00)
Glucose, Bld: 98 mg/dL (ref 70–99)
Potassium: 3.7 mEq/L (ref 3.5–5.1)
SODIUM: 139 meq/L (ref 135–145)
TOTAL PROTEIN: 7.7 g/dL (ref 6.0–8.3)
Total Bilirubin: 1.2 mg/dL (ref 0.2–1.2)

## 2014-10-05 LAB — LIPID PANEL
Cholesterol: 277 mg/dL — ABNORMAL HIGH (ref 0–200)
HDL: 73.9 mg/dL (ref >39.00)
LDL Cholesterol: 165 mg/dL — ABNORMAL HIGH (ref 0–99)
NONHDL: 203.1
Total CHOL/HDL Ratio: 4
Triglycerides: 190 mg/dL — ABNORMAL HIGH (ref 0.0–149.0)
VLDL: 38 mg/dL (ref 0.0–40.0)

## 2014-10-05 NOTE — Progress Notes (Signed)
   Subjective:    Patient ID: [name], female    DOB: 1935/07/02, 79 y.o.   MRN: [id number]  DOS:  10/05/2014 Type of visit - description : Routine visit, likes to discuss her colonoscopy and  blood pressure Interval history: Had a colonoscopy in 2006, next colonoscopy? Is it appropriate to proceed ? Hypertension, self discontinue medications while back, no specific reason, ambulatory BPs usually in the 130s/70. High cholesterol, was prescribed Lipitor by cardiology, apparently never tried, no specific reason, she just simply does not like to take medication. Self discontinue Lamictal because of nausea. Good compliance with Keppra, no apparent side effects.      ROS No chest pain or difficulty breathing, no  seizure activity  Past Medical History  Diagnosis Date  . 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 HTN (hypertension)   . Hyperlipidemia   . PVD (peripheral vascular disease)     saw Dr Albertine Patricia 2007, severe but Asx R Common Iliac artery stenosis, u/s showed no AAA  . Uterine cancer 90s  . Breast CA     h/o , bilateral    Past Surgical History  Procedure Laterality Date  . Mastectomy  2007    bilateral  . Total abdominal hysterectomy  90    abdominal ? vaginal?     History   Social History  . Marital Status: Married    Spouse Name: N/A    Number of Children: N/A  . Years of Education: N/A   Occupational History  . Not on file.   Social History Main Topics  . Smoking status: Never Smoker   . Smokeless tobacco: Never Used  . Alcohol Use: Yes     Comment: 2 glasses of wine daily  . Drug Use: No  . Sexual Activity: Not on file   Other Topics Concern  . Not on file   Social History Narrative  . No narrative on file        Medication List       This list is accurate as of: 10/05/14  7:53 PM.  Always use your most recent med list.               aspirin 325 MG tablet  Take 325 mg by mouth daily.     levETIRAcetam  500 MG tablet  Commonly known as:  KEPPRA  take 1 tablet in the a.m., 1.5 in the p.m           Objective:   Physical Exam BP 124/68  Pulse 82  Temp(Src) 98.2 F (36.8 C) (Oral)  Wt 177 lb 6 oz (80.457 kg)  SpO2 95%  General -- alert, well-developed, NAD.   HEENT-- Not pale.   Lungs -- normal respiratory effort, no intercostal retractions, no accessory muscle use, and normal breath sounds.  Heart-- normal rate, regular rhythm, no murmur.   Extremities-- no pretibial edema bilaterally  Neurologic--  alert & oriented X3. Speech normal, gait appropriate for age, strength symmetric and appropriate for age.  Psych-- Cognition and judgment appear intact. Cooperative with normal attention span and concentration. No anxious or depressed appearing.       Assessment & Plan:

## 2014-10-05 NOTE — Assessment & Plan Note (Signed)
Was prescribed Lipitor, did not take, simply does not like to take medication. Plan: Labs

## 2014-10-05 NOTE — Assessment & Plan Note (Addendum)
We are now doing physical today but I gather the following information.  Had a flu shot Prevnar  Today I reviewed the chart, she had a hyperplastic colon polyp 07/15/2005, next colonoscopy should be 2016. Reassess the issue when she come back in 6 months for a physical.  Has not seen  gynecology recently, recommend to make an appointment  History of breast cancer, status post bilateral mastectomy, has not seen anybody for follow-up.   Strongly recommend to make an appointment for a physical, see instructions

## 2014-10-05 NOTE — Assessment & Plan Note (Signed)
On keppra, no sx due to see neurology, encouraged to call.   Self d/c lamictal d/t nausea

## 2014-10-05 NOTE — Assessment & Plan Note (Signed)
Self discontinue Lotensin HCT for no apparent reason, ambulatory BPs okay, BP today okay. Plan: BMP, continue monitoring BPs

## 2014-10-05 NOTE — Patient Instructions (Signed)
Get your blood work before you leave   Please make an appointment to see your gynecologist and Dr. Johnsie Cancel your cardiologist    Please come back to the office in 6 months  for a physical exam. Come back fasting

## 2014-10-05 NOTE — Progress Notes (Signed)
Pre visit review using our clinic review tool, if applicable. No additional management support is needed unless otherwise documented below in the visit note. 

## 2014-10-09 ENCOUNTER — Telehealth: Payer: Self-pay | Admitting: Internal Medicine

## 2014-10-09 MED ORDER — ATORVASTATIN CALCIUM 20 MG PO TABS: 20.0000 mg | ORAL_TABLET | Freq: Every day | ORAL | Status: DC

## 2014-10-09 NOTE — Telephone Encounter (Signed)
Caller name: Alvena, Kiernan Relation to pt: self  Call back number: [phone]   Reason for call:   pt would like to discuss taking the atorvastatin (LIPITOR) 20 MG tablet in 10MG .

## 2014-10-09 NOTE — Telephone Encounter (Signed)
Okay to take Lipitor 20 mg half tablet daily

## 2014-10-09 NOTE — Telephone Encounter (Signed)
Spoke with [name], she would like to know if she can just take Lipitor 10 mg instead of the 20 mg to see if that will help lower her LDL and cholesterol until her next visit.

## 2014-10-09 NOTE — Addendum Note (Signed)
Addended by: Wilfrid Lund on: 10/09/2014 01:49 PM   Modules accepted: Orders

## 2014-10-10 ENCOUNTER — Telehealth: Payer: Self-pay | Admitting: Internal Medicine

## 2014-10-10 NOTE — Telephone Encounter (Signed)
Spoke with Pt and informed her that she can take Lipitor 20 mg half tablets until next appt.

## 2014-10-10 NOTE — Telephone Encounter (Signed)
Pt states she spoke with you early, and would like to know if you can mail her a copy of lab results.

## 2014-10-10 NOTE — Telephone Encounter (Signed)
Mailed lab results to Pt as requested.

## 2014-11-08 ENCOUNTER — Other Ambulatory Visit: Payer: Self-pay

## 2014-11-15 ENCOUNTER — Other Ambulatory Visit: Payer: Self-pay

## 2014-11-15 MED ORDER — ATORVASTATIN CALCIUM 20 MG PO TABS: 20.0000 mg | ORAL_TABLET | Freq: Every day | ORAL | Status: DC

## 2014-12-11 ENCOUNTER — Telehealth: Payer: Self-pay

## 2014-12-11 NOTE — Telephone Encounter (Signed)
Called and spoke with patient due to spouses concerns that she is breathing shallow while sleeping. Spouse reports some wheezing and coughing during sleeping hours. Patient states that she has been tired but feels that it is due to inability to sleep due to some coughing. Patient is not in any immediate distress, no wheezing or coughing, denies SOB and has the ability to carry on a conversation without hesitations. Advised that we would be glad to see her if symptoms continue or worsen. Patient agrees to call if anything changes.

## 2014-12-11 NOTE — Telephone Encounter (Signed)
thx

## 2014-12-12 ENCOUNTER — Ambulatory Visit (INDEPENDENT_AMBULATORY_CARE_PROVIDER_SITE_OTHER): Payer: Medicare Other | Admitting: Internal Medicine

## 2014-12-12 ENCOUNTER — Encounter: Payer: Self-pay | Admitting: Internal Medicine

## 2014-12-12 ENCOUNTER — Ambulatory Visit (HOSPITAL_BASED_OUTPATIENT_CLINIC_OR_DEPARTMENT_OTHER)
Admission: RE | Admit: 2014-12-12 | Discharge: 2014-12-12 | Disposition: A | Discharge status: Home / Self Care | Payer: Medicare Other | Source: Ambulatory Visit | Attending: Internal Medicine | Admitting: Internal Medicine

## 2014-12-12 VITALS — BP 134/68 | HR 114 | Temp 97.8°F | Wt 183.2 lb

## 2014-12-12 DIAGNOSIS — I4819 Other persistent atrial fibrillation: Secondary | ICD-10-CM

## 2014-12-12 DIAGNOSIS — I4891 Unspecified atrial fibrillation: Secondary | ICD-10-CM

## 2014-12-12 DIAGNOSIS — I10 Essential (primary) hypertension: Secondary | ICD-10-CM

## 2014-12-12 DIAGNOSIS — C55 Malignant neoplasm of uterus, part unspecified: Secondary | ICD-10-CM | POA: Insufficient documentation

## 2014-12-12 DIAGNOSIS — R000 Tachycardia, unspecified: Secondary | ICD-10-CM

## 2014-12-12 DIAGNOSIS — R918 Other nonspecific abnormal finding of lung field: Secondary | ICD-10-CM | POA: Insufficient documentation

## 2014-12-12 DIAGNOSIS — Z9013 Acquired absence of bilateral breasts and nipples: Secondary | ICD-10-CM | POA: Insufficient documentation

## 2014-12-12 DIAGNOSIS — I517 Cardiomegaly: Secondary | ICD-10-CM | POA: Insufficient documentation

## 2014-12-12 DIAGNOSIS — I700 Atherosclerosis of aorta: Secondary | ICD-10-CM | POA: Insufficient documentation

## 2014-12-12 DIAGNOSIS — R05 Cough: Secondary | ICD-10-CM | POA: Insufficient documentation

## 2014-12-12 DIAGNOSIS — C50919 Malignant neoplasm of unspecified site of unspecified female breast: Secondary | ICD-10-CM | POA: Insufficient documentation

## 2014-12-12 MED ORDER — METOPROLOL TARTRATE 25 MG PO TABS: 25.0000 mg | ORAL_TABLET | Freq: Two times a day (BID) | ORAL | Status: DC

## 2014-12-12 MED ORDER — RIVAROXABAN 20 MG PO TABS: 20.0000 mg | ORAL_TABLET | Freq: Every day | ORAL | Status: DC

## 2014-12-12 NOTE — Progress Notes (Signed)
Pre visit review using our clinic review tool, if applicable. No additional management support is needed unless otherwise documented below in the visit note. 

## 2014-12-12 NOTE — Assessment & Plan Note (Addendum)
Patient presents today for evaluation of cough, on further questioning she has been also tired for at least 2 months. On exam she was noted to be tachycardic, EKG confirmed A- fibrillation with a rate of 118. Cough,  ? related to A. Fib. Case discussed with cardiology, the plan is as follows: Labs Start anticoagulation with Xarelto 20 mg daily. Risk and benefits of anticoagulation discussed with the patient and her husband Chest x-ray [facility] see cardiology within few days Rate control with metoprolol 25 twice a day Decrease aspirin to 81 mg ER if chest pain, difficulty breathing or other symptoms

## 2014-12-12 NOTE — Patient Instructions (Signed)
Get your blood work before you leave   Stop by the first floor and get the XR    Xarelto 20 mg one tablet every night  Decrease aspirin to 81 mg daily  Start metoprolol 25 mg one tablet twice a day  Please call or go to the ER if you have chest pain, difficulty breathing, palpitations, headaches, any bleeding.

## 2014-12-12 NOTE — Progress Notes (Signed)
Subjective:    Patient ID: [name], female    DOB: [birth year], 80 y.o.   MRN: [id number]  DOS:  12/12/2014 Type of visit - description : acute Interval history: Her chief complaint today is cough, this is going on for 2 months, worse at night, sometimes she coughs so hard that she is short of breath and wheezing. I notice her to be tachycardic and on further questioning she admits to feeling fatigued for 2 months but did not notice any palpitations.    ROS Denies fever, chills, no unintended weight loss. + Postnasal dripping without sinus pain or congestion No chest pain or difficulty breathing No lower extremity edema although she has some pain bilaterally. No recent airplane trips or prolonged car trip.  Past Medical History  Diagnosis Date  . 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 HTN (hypertension)   . Hyperlipidemia   . PVD (peripheral vascular disease)     saw Dr Albertine Patricia 2007, severe but Asx R Common Iliac artery stenosis, u/s showed no AAA  . Uterine cancer 90s  . Breast CA     h/o , bilateral    Past Surgical History  Procedure Laterality Date  . Mastectomy  2007    bilateral  . Total abdominal hysterectomy  90    abdominal ? vaginal?     History   Social History  . Marital Status: Married    Spouse Name: N/A    Number of Children: N/A  . Years of Education: N/A   Occupational History  . Not on file.   Social History Main Topics  . Smoking status: Never Smoker   . Smokeless tobacco: Never Used  . Alcohol Use: Yes     Comment: 2 glasses of wine daily  . Drug Use: No  . Sexual Activity: Not on file   Other Topics Concern  . Not on file   Social History Narrative        Medication List       This list is accurate as of: 12/12/14 11:59 PM.  Always use your most recent med list.               aspirin 81 MG tablet  Take 81 mg by mouth daily.     atorvastatin 20 MG tablet  Commonly known as:  LIPITOR  Take  1 tablet (20 mg total) by mouth at bedtime.     levETIRAcetam 750 MG tablet  Commonly known as:  KEPPRA  Take 750 mg by mouth 2 (two) times daily.     metoprolol tartrate 25 MG tablet  Commonly known as:  LOPRESSOR  Take 1 tablet (25 mg total) by mouth 2 (two) times daily.     rivaroxaban 20 MG Tabs tablet  Commonly known as:  XARELTO  Take 1 tablet (20 mg total) by mouth daily with supper.           Objective:   Physical Exam BP 134/68 mmHg  Pulse 114  Temp(Src) 97.8 F (36.6 C) (Oral)  Wt 183 lb 4 oz (83.122 kg)  SpO2 100% General -- alert, well-developed, NAD.  Neck --no thyromegaly  HEENT-- Not pale.  Lungs -- normal respiratory effort, no intercostal retractions, no accessory muscle use, and normal breath sounds.  Heart-- tachycardic, irreg.  Abdomen-- Not distended, good bowel sounds,soft, non-tender. Extremities-- no pretibial edema bilaterally, calves symmetric  Neurologic--  alert & oriented X3. Speech normal, gait  appropriate for age, strength symmetric and appropriate for age.   Psych-- Cognition and judgment appear intact. Cooperative with normal attention span and concentration. Slt anxious      Assessment & Plan:

## 2014-12-13 LAB — CBC WITH DIFFERENTIAL/PLATELET
Basophils Absolute: 0 10*3/uL (ref 0.0–0.1)
Basophils Relative: 0.4 % (ref 0.0–3.0)
Eosinophils Absolute: 0.1 10*3/uL (ref 0.0–0.7)
Eosinophils Relative: 0.8 % (ref 0.0–5.0)
HCT: 46.7 % — ABNORMAL HIGH (ref 36.0–46.0)
Hemoglobin: 15.1 g/dL — ABNORMAL HIGH (ref 12.0–15.0)
LYMPHS PCT: 10.8 % — AB (ref 12.0–46.0)
Lymphs Abs: 1 10*3/uL (ref 0.7–4.0)
MCHC: 32.3 g/dL (ref 30.0–36.0)
MCV: 92 fl (ref 78.0–100.0)
MONO ABS: 0.9 10*3/uL (ref 0.1–1.0)
MONOS PCT: 9.4 % (ref 3.0–12.0)
NEUTROS ABS: 7.5 10*3/uL (ref 1.4–7.7)
Neutrophils Relative %: 78.6 % — ABNORMAL HIGH (ref 43.0–77.0)
Platelets: 232 10*3/uL (ref 150.0–400.0)
RBC: 5.07 Mil/uL (ref 3.87–5.11)
RDW: 13.5 % (ref 11.5–15.5)
WBC: 9.5 10*3/uL (ref 4.0–10.5)

## 2014-12-13 LAB — COMPREHENSIVE METABOLIC PANEL
ALK PHOS: 106 U/L (ref 39–117)
ALT: 34 U/L (ref 0–35)
AST: 38 U/L — ABNORMAL HIGH (ref 0–37)
Albumin: 4.5 g/dL (ref 3.5–5.2)
BUN: 13 mg/dL (ref 6–23)
CHLORIDE: 105 meq/L (ref 96–112)
CO2: 22 meq/L (ref 19–32)
Calcium: 9.2 mg/dL (ref 8.4–10.5)
Creatinine, Ser: 0.9 mg/dL (ref 0.4–1.2)
GFR: 62.83 mL/min (ref >60.00)
GLUCOSE: 100 mg/dL — AB (ref 70–99)
POTASSIUM: 4.7 meq/L (ref 3.5–5.1)
Sodium: 139 mEq/L (ref 135–145)
Total Bilirubin: 1.7 mg/dL — ABNORMAL HIGH (ref 0.2–1.2)
Total Protein: 7.5 g/dL (ref 6.0–8.3)

## 2014-12-13 LAB — MAGNESIUM: Magnesium: 2.2 mg/dL (ref 1.5–2.5)

## 2014-12-13 LAB — TSH: TSH: 1.72 u[IU]/mL (ref 0.35–4.50)

## 2014-12-14 ENCOUNTER — Emergency Department (HOSPITAL_BASED_OUTPATIENT_CLINIC_OR_DEPARTMENT_OTHER)
Admission: EM | Admit: 2014-12-14 | Discharge: 2014-12-14 | Disposition: A | Discharge status: Home / Self Care | Payer: Medicare Other | Source: Home / Self Care | Attending: Emergency Medicine | Admitting: Emergency Medicine

## 2014-12-14 ENCOUNTER — Telehealth: Payer: Self-pay | Admitting: Cardiovascular Disease

## 2014-12-14 ENCOUNTER — Telehealth: Payer: Self-pay

## 2014-12-14 ENCOUNTER — Encounter (HOSPITAL_BASED_OUTPATIENT_CLINIC_OR_DEPARTMENT_OTHER): Payer: Self-pay | Admitting: *Deleted

## 2014-12-14 ENCOUNTER — Emergency Department (HOSPITAL_BASED_OUTPATIENT_CLINIC_OR_DEPARTMENT_OTHER): Payer: Medicare Other

## 2014-12-14 ENCOUNTER — Encounter: Payer: Self-pay | Admitting: Internal Medicine

## 2014-12-14 DIAGNOSIS — R0602 Shortness of breath: Secondary | ICD-10-CM | POA: Diagnosis not present

## 2014-12-14 DIAGNOSIS — J90 Pleural effusion, not elsewhere classified: Secondary | ICD-10-CM | POA: Diagnosis not present

## 2014-12-14 DIAGNOSIS — R0600 Dyspnea, unspecified: Secondary | ICD-10-CM

## 2014-12-14 DIAGNOSIS — I509 Heart failure, unspecified: Secondary | ICD-10-CM

## 2014-12-14 DIAGNOSIS — I4819 Other persistent atrial fibrillation: Secondary | ICD-10-CM

## 2014-12-14 HISTORY — DX: Unspecified atrial fibrillation: I48.91

## 2014-12-14 LAB — COMPREHENSIVE METABOLIC PANEL
ALT: 205 U/L — ABNORMAL HIGH (ref 0–35)
AST: 223 U/L — AB (ref 0–37)
Albumin: 4.2 g/dL (ref 3.5–5.2)
Alkaline Phosphatase: 163 U/L — ABNORMAL HIGH (ref 39–117)
Anion gap: 11 (ref 5–15)
BUN: 22 mg/dL (ref 6–23)
CALCIUM: 8.9 mg/dL (ref 8.4–10.5)
CO2: 19 mmol/L (ref 19–32)
Chloride: 105 mEq/L (ref 96–112)
Creatinine, Ser: 0.88 mg/dL (ref 0.50–1.10)
GFR calc Af Amer: 72 mL/min — ABNORMAL LOW (ref >90)
GFR calc non Af Amer: 62 mL/min — ABNORMAL LOW (ref >90)
Glucose, Bld: 126 mg/dL — ABNORMAL HIGH (ref 70–99)
Potassium: 3.5 mmol/L (ref 3.5–5.1)
SODIUM: 135 mmol/L (ref 135–145)
Total Bilirubin: 1.2 mg/dL (ref 0.3–1.2)
Total Protein: 6.9 g/dL (ref 6.0–8.3)

## 2014-12-14 LAB — CBC
HCT: 50.7 % — ABNORMAL HIGH (ref 36.0–46.0)
HEMOGLOBIN: 16.8 g/dL — AB (ref 12.0–15.0)
MCH: 29.7 pg (ref 26.0–34.0)
MCHC: 33.1 g/dL (ref 30.0–36.0)
MCV: 89.6 fL (ref 78.0–100.0)
Platelets: 261 10*3/uL (ref 150–400)
RBC: 5.66 MIL/uL — ABNORMAL HIGH (ref 3.87–5.11)
RDW: 13 % (ref 11.5–15.5)
WBC: 12.3 10*3/uL — ABNORMAL HIGH (ref 4.0–10.5)

## 2014-12-14 LAB — APTT: aPTT: 38 seconds — ABNORMAL HIGH (ref 24–37)

## 2014-12-14 LAB — BRAIN NATRIURETIC PEPTIDE: B NATRIURETIC PEPTIDE 5: 426.6 pg/mL — AB (ref 0.0–100.0)

## 2014-12-14 LAB — TROPONIN I

## 2014-12-14 MED ORDER — FUROSEMIDE 20 MG PO TABS: 20.0000 mg | ORAL_TABLET | Freq: Every day | ORAL | Status: DC

## 2014-12-14 NOTE — Telephone Encounter (Signed)
New Message  Pt Husband wants to speak with Rn about pt latest dx. Asking about possible early appt. Please call back and discuss.

## 2014-12-14 NOTE — Telephone Encounter (Signed)
Spoke with patient's husband who states that patient's symptoms have worsen.  Yesterday she barely left the couch due to weakness and fatigue.  States her breathing still seems rapid and shallow.  He is concerns about her would like to for her to be seen today. Spoke with patient, who states that she is feeling more fatigued that she did on Tuesday, but denies chest pain, palpitations, shortness of breath headache or signs of bleeding.     Advice:  Go to [facility] ER for evaluation.  Pt and husband stated understanding and agreed.

## 2014-12-14 NOTE — Discharge Instructions (Signed)
Atrial Fibrillation CONTINUE ALL OF YOUR REGULAR MEDICATIONS.  Atrial fibrillation is a type of irregular heart rhythm (arrhythmia). During atrial fibrillation, the upper chambers of the heart (atria) quiver continuously in a chaotic pattern. This causes an irregular and often rapid heart rate.  Atrial fibrillation is the result of the heart becoming overloaded with disorganized signals that tell it to beat. These signals are normally released one at a time by a part of the right atrium called the sinoatrial node. They then travel from the atria to the lower chambers of the heart (ventricles), causing the atria and ventricles to contract and pump blood as they pass. In atrial fibrillation, parts of the atria outside of the sinoatrial node also release these signals. This results in two problems. First, the atria receive so many signals that they do not have time to fully contract. Second, the ventricles, which can only receive one signal at a time, beat irregularly and out of rhythm with the atria.  There are three types of atrial fibrillation:   Paroxysmal. Paroxysmal atrial fibrillation starts suddenly and stops on its own within a week.  Persistent. Persistent atrial fibrillation lasts for more than a week. It may stop on its own or with treatment.  Permanent. Permanent atrial fibrillation does not go away. Episodes of atrial fibrillation may lead to permanent atrial fibrillation. Atrial fibrillation can prevent your heart from pumping blood normally. It increases your risk of stroke and can lead to heart failure.  CAUSES   Heart conditions, including a heart attack, heart failure, coronary artery disease, and heart valve conditions.   Inflammation of the sac that surrounds the heart (pericarditis).  Blockage of an artery in the lungs (pulmonary embolism).  Pneumonia or other infections.  Chronic lung disease.  Thyroid problems, especially if the thyroid is overactive  (hyperthyroidism).  Caffeine, excessive alcohol use, and use of some illegal drugs.   Use of some medicines, including certain decongestants and diet pills.  Heart surgery.   Birth defects.  Sometimes, no cause can be found. When this happens, the atrial fibrillation is called lone atrial fibrillation. The risk of complications from atrial fibrillation increases if you have lone atrial fibrillation and you are age 104 years or older. RISK FACTORS  Heart failure.  Coronary artery disease.  Diabetes mellitus.   High blood pressure (hypertension).   Obesity.   Other arrhythmias.   Increased age. SIGNS AND SYMPTOMS   A feeling that your heart is beating rapidly or irregularly.   A feeling of discomfort or pain in your chest.   Shortness of breath.   Sudden light-headedness or weakness.   Getting tired easily when exercising.   Urinating more often than normal (mainly when atrial fibrillation first begins).  In paroxysmal atrial fibrillation, symptoms may start and suddenly stop. DIAGNOSIS  Your health care provider may be able to detect atrial fibrillation when taking your pulse. Your health care provider may have you take a test called an ambulatory electrocardiogram (ECG). An ECG records your heartbeat patterns over a 24-hour period. You may also have other tests, such as:  Transthoracic echocardiogram (TTE). During echocardiography, sound waves are used to evaluate how blood flows through your heart.  Transesophageal echocardiogram (TEE).  Stress test. There is more than one type of stress test. If a stress test is needed, ask your health care provider about which type is best for you.  Chest X-ray exam.  Blood tests.  Computed tomography (CT). TREATMENT  Treatment may include:  Treating  any underlying conditions. For example, if you have an overactive thyroid, treating the condition may correct atrial fibrillation.  Taking medicine. Medicines may  be given to control a rapid heart rate or to prevent blood clots, heart failure, or a stroke.  Having a procedure to correct the rhythm of the heart:  Electrical cardioversion. During electrical cardioversion, a controlled, low-energy shock is delivered to the heart through your skin. If you have chest pain, very low blood pressure, or sudden heart failure, this procedure may need to be done as an emergency.  Catheter ablation. During this procedure, heart tissues that send the signals that cause atrial fibrillation are destroyed.  Surgical ablation. During this surgery, thin lines of heart tissue that carry the abnormal signals are destroyed. This procedure can either be an open-heart surgery or a minimally invasive surgery. With the minimally invasive surgery, small cuts are made to access the heart instead of a large opening.  Pulmonary venous isolation. During this surgery, tissue around the veins that carry blood from the lungs (pulmonary veins) is destroyed. This tissue is thought to carry the abnormal signals. HOME CARE INSTRUCTIONS   Take medicines only as directed by your health care provider. Some medicines can make atrial fibrillation worse or recur.  If blood thinners were prescribed by your health care provider, take them exactly as directed. Too much blood-thinning medicine can cause bleeding. If you take too little, you will not have the needed protection against stroke and other problems.  Perform blood tests at home if directed by your health care provider. Perform blood tests exactly as directed.  Quit smoking if you smoke.  Do not drink alcohol.  Do not drink caffeinated beverages such as coffee, soda, and some teas. You may drink decaffeinated coffee, soda, or tea.   Maintain a healthy weight.Do not use diet pills unless your health care provider approves. They may make heart problems worse.   Follow diet instructions as directed by your health care  provider.  Exercise regularly as directed by your health care provider.  Keep all follow-up visits as directed by your health care provider. This is important. PREVENTION  The following substances can cause atrial fibrillation to recur:   Caffeinated beverages.  Alcohol.  Certain medicines, especially those used for breathing problems.  Certain herbs and herbal medicines, such as those containing ephedra or ginseng.  Illegal drugs, such as cocaine and amphetamines. Sometimes medicines are given to prevent atrial fibrillation from recurring. Proper treatment of any underlying condition is also important in helping prevent recurrence.  SEEK MEDICAL CARE IF:  You notice a change in the rate, rhythm, or strength of your heartbeat.  You suddenly begin urinating more frequently.  You tire more easily when exerting yourself or exercising. SEEK IMMEDIATE MEDICAL CARE IF:   You have chest pain, abdominal pain, sweating, or weakness.  You feel nauseous.  You have shortness of breath.  You suddenly have swollen feet and ankles.  You feel dizzy.  Your face or limbs feel numb or weak.  You have a change in your vision or speech. MAKE SURE YOU:   Understand these instructions.  Will watch your condition.  Will get help right away if you are not doing well or get worse. Document Released: 11/24/2005 Document Revised: 04/10/2014 Document Reviewed: 01/04/2013 [facility] Patient Information 2015 [place], [place]. This information is not intended to replace advice given to you by your health care provider. Make sure you discuss any questions you have with your health care  provider.  Anticoagulation, Generic Anticoagulants are medicines used to prevent clots from developing in your veins. These medicine are also known as blood thinners. If blood clots are untreated, they could travel to your lungs. This is called a pulmonary embolus. A blood clot in your lungs can be fatal.  Health care  providers often use anticoagulants to prevent clots following surgery. Anticoagulants are also used along with aspirin when the heart is not getting enough blood. Another anticoagulant called warfarin is started 2 to 3 days after a rapid-acting injectable anticoagulant is started. The rapid-acting anticoagulants are usually continued until warfarin has begun to work. Your health care provider will judge this length of time by blood tests known as the prothrombin time (PT) and International Normalization Ratio (INR). This means that your blood is at the necessary and best level to prevent clots. RISKS AND COMPLICATIONS  If you have received recent epidural anesthesia, spinal anesthesia, or a spinal tap while receiving anticoagulants, you are at risk for developing a blood clot in or around the spine. This condition could result in long-term or permanent paralysis.  Because anticoagulants thin your blood, severe bleeding may occur from any tissue or organ. Symptoms of the blood being too thin may include:  Bleeding from the nose or gums that does not stop quickly.  Blood in bowel movements which may appear as bright red, dark, or black tarry stools.  Blood in the urine which may appear as pink, red, or brown urine.  Unusual bruising or bruising easily.  A cut that does not stop bleeding within 10 minutes.  Vomiting blood or continuous nausea for more than 1 day.  Coughing up blood.  Broken blood vessels in your eye (subconjunctival hemorrhage).  Abdominal or back pain with or without flank bruising.  Sudden, severe headache.  Sudden weakness or numbness of the face, arm, or leg, especially on one side of the body.  Sudden confusion.  Trouble speaking (aphasia) or understanding.  Sudden trouble seeing in one or both eyes.  Sudden trouble walking.  Dizziness.  Loss of balance or coordination.  Vaginal bleeding.  Swelling or pain at an injection site.  Superficial fat tissue  death (necrosis) which may cause skin scarring. This is more common in women and may first present as pain in the waist, thighs, or buttocks.  Fever.  Too little anticoagulation continues to allow the risk for blood clots. HOME CARE INSTRUCTIONS   Due to the complications of anticoagulants, it is very important that you take your anticoagulant as directed by your health care provider. Anticoagulants need to be taken exactly as instructed. Be sure you understand all your anticoagulant instructions.  Keep all follow-up appointments with your health care provider as directed. It is very important to keep your appointments. Not keeping appointments could result in a chronic or permanent injury, pain, or disability.  Warfarin. Your health care provider will advise you on the length of treatment (usually 3-6 months, sometimes lifelong).  Take warfarin exactly as directed by your health care provider. It is recommended that you take your warfarin dose at the same time of the day. It is preferred that you take warfarin in the late afternoon. If you have been told to stop taking warfarin, do not resume taking warfarin until directed to do so by your health care provider. Follow your health care provider's instructions if you accidentally take an extra dose or miss a dose of warfarin. It is very important to take warfarin as directed since  bleeding or blood clots could result in chronic or permanent injury, pain, or disability.  Too much and too little warfarin are both dangerous. Too much warfarin increases the risk of bleeding. Too little warfarin continues to allow the risk for blood clots. While taking warfarin, you will need to have regular blood tests to measure your blood clotting time. These blood tests usually include both the prothrombin time (PT) and International Normalized Ratio (INR) tests. The PT and INR results allow your health care provider to adjust your dose of warfarin. The dose can change  for many reasons. It is critically important that you have your PT and INR levels drawn exactly as directed. Your warfarin dose may stay the same or change depending on what the PT and INR results are. Be sure to follow up with your health care provider regarding your PT and INR test results and what your warfarin dosage should be.  Many medicines can interfere with warfarin and affect the PT and INR results. You must tell your health care provider about any and all medicines you take, this includes all vitamins and supplements. Ask your health care provider before taking these. Prescription and over-the-counter medicine consistency is critical to warfarin management. It is important that potential interactions are checked before you start a new medicine. Be especially cautious with aspirin and anti-inflammatory medicines. Ask your health care provider before taking these. Medicines such as antibiotics and acid-reducing medicine can interact with warfarin and can cause an increased warfarin effect. Warfarin can also interfere with the effectiveness of medicines you are taking. Do not take or discontinue any prescribed or over-the-counter medicine except on the advice of your health care provider or pharmacist.  Some vitamins, supplements, and herbal products interfere with the effectiveness of warfarin. Vitamin E may increase the anticoagulant effects of warfarin. Vitamin K may can cause warfarin to be less effective. Do not take or discontinue any vitamin, supplement, or herbal product except on the advice of your health care provider or pharmacist.  Eat what you normally eat and keep the vitamin K content of your diet consistent. Avoid major changes in your diet, or notify your health care provider before changing your diet. Suddenly getting a lot more vitamin K could cause your blood to clot too quickly. A sudden decrease in vitamin K intake could cause your blood to clot too slowly. These changes in  vitamin K intake could lead to dangerous blood clotsor to bleeding. To keep your vitamin K intake consistent, you must be aware of which foods contain moderate or high amounts of vitamin K. Some foods high in vitamin K include spinach, kale, broccoli, cabbage, greens, Brussels sprouts, asparagus, [place], coleslaw, parsley, and green tea. Arrange a visit with a dietitian to answer your questions.  If you have a loss of appetite or get the stomach flu (viral gastroenteritis), talk to your health care provider as soon as possible. A decrease in your normal vitamin K intake can make you more sensitive to your usual dose of warfarin.  Some medical conditions may increase your risk for bleeding while you are taking warfarin. A fever, diarrhea lasting more than a day, worsening heart failure, or worsening liver function are some medical conditions that could affect warfarin. Contact your health care provider if you have any of these medical conditions.  Alcohol can change the body's ability to handle warfarin. It is best to avoid alcoholic drinks or consume only very small amounts while taking warfarin. Notify your health  care provider if you change your alcohol intake. A sudden increase in alcohol use can increase your risk of bleeding. Chronic alcohol use can cause warfarin to be less effective.  Be careful not to cut yourself when using sharp objects or while shaving.  Inform all your health care providers and your dentist that you take an anticoagulant.  Limit physical activities or sports that could result in a fall or cause injury. Avoid contact sports.  Wear medical alert jewelry or carry a medical alert card. SEEK IMMEDIATE MEDICAL CARE IF:  You cough up blood.  You have dark or black stools or there is bright red blood coming from your rectum.  You vomit blood or have nausea for more than 1 day.  You have blood in the urine or pink colored urine.  You have unusual bruising or have  increased bruising.  You have bleeding from the nose or gums that does not stop quickly.  You have a cut that does not stop bleeding within a 2-3 minutes.  You have sudden weakness or numbness of the face, arm, or leg, especially on one side of the body.  You have sudden confusion.  You have trouble speaking (aphasia) or understanding.  You have sudden trouble seeing in one or both eyes.  You have sudden trouble walking.  You have dizziness.  You have a loss of balance or coordination.  You have a sudden, severe headache.  You have a serious fall or head injury, even if you are not bleeding.  You have swelling or pain at an injection site.  You have unexplained tenderness or pain in the abdomen, back, waist, thighs or buttocks.  You have a fever. Any of these symptoms may represent a serious problem that is an emergency. Do not wait to see if the symptoms will go away. Get medical help right away. Call your local emergency services (911 in [place] Do not drive yourself to the hospital. Document Released: 11/24/2005 Document Revised: 11/29/2013 Document Reviewed: 06/28/2008 [facility] Patient Information 2015 [place], [place]. This information is not intended to replace advice given to you by your health care provider. Make sure you discuss any questions you have with your health care provider. Heart Failure Heart failure is a condition in which the heart has trouble pumping blood. This means your heart does not pump blood efficiently for your body to work well. In some cases of heart failure, fluid may back up into your lungs or you may have swelling (edema) in your lower legs. Heart failure is usually a long-term (chronic) condition. It is important for you to take good care of yourself and follow your health care provider's treatment plan. CAUSES  Some health conditions can cause heart failure. Those health conditions include:  High blood pressure (hypertension). Hypertension  causes the heart muscle to work harder than normal. When pressure in the blood vessels is high, the heart needs to pump (contract) with more force in order to circulate blood throughout the body. High blood pressure eventually causes the heart to become stiff and weak.  Coronary artery disease (CAD). CAD is the buildup of cholesterol and fat (plaque) in the arteries of the heart. The blockage in the arteries deprives the heart muscle of oxygen and blood. This can cause chest pain and may lead to a heart attack. High blood pressure can also contribute to CAD.  Heart attack (myocardial infarction). A heart attack occurs when one or more arteries in the heart become blocked. The loss of  oxygen damages the muscle tissue of the heart. When this happens, part of the heart muscle dies. The injured tissue does not contract as well and weakens the heart's ability to pump blood.  Abnormal heart valves. When the heart valves do not open and close properly, it can cause heart failure. This makes the heart muscle pump harder to keep the blood flowing.  Heart muscle disease (cardiomyopathy or myocarditis). Heart muscle disease is damage to the heart muscle from a variety of causes. These can include drug or alcohol abuse, infections, or unknown reasons. These can increase the risk of heart failure.  Lung disease. Lung disease makes the heart work harder because the lungs do not work properly. This can cause a strain on the heart, leading it to fail.  Diabetes. Diabetes increases the risk of heart failure. High blood sugar contributes to high fat (lipid) levels in the blood. Diabetes can also cause slow damage to tiny blood vessels that carry important nutrients to the heart muscle. When the heart does not get enough oxygen and food, it can cause the heart to become weak and stiff. This leads to a heart that does not contract efficiently.  Other conditions can contribute to heart failure. These include abnormal heart  rhythms, thyroid problems, and low blood counts (anemia). Certain unhealthy behaviors can increase the risk of heart failure, including:  Being overweight.  Smoking or chewing tobacco.  Eating foods high in fat and cholesterol.  Abusing illicit drugs or alcohol.  Lacking physical activity. SYMPTOMS  Heart failure symptoms may vary and can be hard to detect. Symptoms may include:  Shortness of breath with activity, such as climbing stairs.  Persistent cough.  Swelling of the feet, ankles, legs, or abdomen.  Unexplained weight gain.  Difficulty breathing when lying flat (orthopnea).  Waking from sleep because of the need to sit up and get more air.  Rapid heartbeat.  Fatigue and loss of energy.  Feeling light-headed, dizzy, or close to fainting.  Loss of appetite.  Nausea.  Increased urination during the night (nocturia). DIAGNOSIS  A diagnosis of heart failure is based on your history, symptoms, physical examination, and diagnostic tests. Diagnostic tests for heart failure may include:  Echocardiography.  Electrocardiography.  Chest X-ray.  Blood tests.  Exercise stress test.  Cardiac angiography.  Radionuclide scans. TREATMENT  Treatment is aimed at managing the symptoms of heart failure. Medicines, behavioral changes, or surgical intervention may be necessary to treat heart failure.  Medicines to help treat heart failure may include:  Angiotensin-converting enzyme (ACE) inhibitors. This type of medicine blocks the effects of a blood protein called angiotensin-converting enzyme. ACE inhibitors relax (dilate) the blood vessels and help lower blood pressure.  Angiotensin receptor blockers (ARBs). This type of medicine blocks the actions of a blood protein called angiotensin. Angiotensin receptor blockers dilate the blood vessels and help lower blood pressure.  Water pills (diuretics). Diuretics cause the kidneys to remove salt and water from the blood. The  extra fluid is removed through urination. This loss of extra fluid lowers the volume of blood the heart pumps.  Beta blockers. These prevent the heart from beating too fast and improve heart muscle strength.  Digitalis. This increases the force of the heartbeat.  Healthy behavior changes include:  Obtaining and maintaining a healthy weight.  Stopping smoking or chewing tobacco.  Eating heart-healthy foods.  Limiting or avoiding alcohol.  Stopping illicit drug use.  Physical activity as directed by your health care provider.  Surgical  treatment for heart failure may include:  A procedure to open blocked arteries, repair damaged heart valves, or remove damaged heart muscle tissue.  A pacemaker to improve heart muscle function and control certain abnormal heart rhythms.  An internal cardioverter defibrillator to treat certain serious abnormal heart rhythms.  A left ventricular assist device (LVAD) to assist the pumping ability of the heart. HOME CARE INSTRUCTIONS   Take medicines only as directed by your health care provider. Medicines are important in reducing the workload of your heart, slowing the progression of heart failure, and improving your symptoms.  Do not stop taking your medicine unless directed by your health care provider.  Do not skip any dose of medicine.  Refill your prescriptions before you run out of medicine. Your medicines are needed every day.  Engage in moderate physical activity if directed by your health care provider. Moderate physical activity can benefit some people. The elderly and people with severe heart failure should consult with a health care provider for physical activity recommendations.  Eat heart-healthy foods. Food choices should be free of trans fat and low in saturated fat, cholesterol, and salt (sodium). Healthy choices include fresh or frozen fruits and vegetables, fish, lean meats, legumes, fat-free or low-fat dairy products, and whole  grain or high fiber foods. Talk to a dietitian to learn more about heart-healthy foods.  Limit sodium if directed by your health care provider. Sodium restriction may reduce symptoms of heart failure in some people. Talk to a dietitian to learn more about heart-healthy seasonings.  Use healthy cooking methods. Healthy cooking methods include roasting, grilling, broiling, baking, poaching, steaming, or stir-frying. Talk to a dietitian to learn more about healthy cooking methods.  Limit fluids if directed by your health care provider. Fluid restriction may reduce symptoms of heart failure in some people.  Weigh yourself every day. Daily weights are important in the early recognition of excess fluid. You should weigh yourself every morning after you urinate and before you eat breakfast. Wear the same amount of clothing each time you weigh yourself. Record your daily weight. Provide your health care provider with your weight record.  Monitor and record your blood pressure if directed by your health care provider.  Check your pulse if directed by your health care provider.  Lose weight if directed by your health care provider. Weight loss may reduce symptoms of heart failure in some people.  Stop smoking or chewing tobacco. Nicotine makes your heart work harder by causing your blood vessels to constrict. Do not use nicotine gum or patches before talking to your health care provider.  Keep all follow-up visits as directed by your health care provider. This is important.  Limit alcohol intake to no more than 1 drink per day for nonpregnant women and 2 drinks per day for men. One drink equals 12 ounces of beer, 5 ounces of wine, or 1 ounces of hard liquor. Drinking more than that is harmful to your heart. Tell your health care provider if you drink alcohol several times a week. Talk with your health care provider about whether alcohol is safe for you. If your heart has already been damaged by alcohol or  you have severe heart failure, drinking alcohol should be stopped completely.  Stop illicit drug use.  Stay up-to-date with immunizations. It is especially important to prevent respiratory infections through current pneumococcal and influenza immunizations.  Manage other health conditions such as hypertension, diabetes, thyroid disease, or abnormal heart rhythms as directed by your  health care provider.  Learn to manage stress.  Plan rest periods when fatigued.  Learn strategies to manage high temperatures. If the weather is extremely hot:  Avoid vigorous physical activity.  Use air conditioning or fans or seek a cooler location.  Avoid caffeine and alcohol.  Wear loose-fitting, lightweight, and light-colored clothing.  Learn strategies to manage cold temperatures. If the weather is extremely cold:  Avoid vigorous physical activity.  Layer clothes.  Wear mittens or gloves, a hat, and a scarf when going outside.  Avoid alcohol.  Obtain ongoing education and support as needed.  Participate in or seek rehabilitation as needed to maintain or improve independence and quality of life. SEEK MEDICAL CARE IF:   Your weight increases by 03 lb/1.4 kg in 1 day or 05 lb/2.3 kg in a week.  You have increasing shortness of breath that is unusual for you.  You are unable to participate in your usual physical activities.  You tire easily.  You cough more than normal, especially with physical activity.  You have any or more swelling in areas such as your hands, feet, ankles, or abdomen.  You are unable to sleep because it is hard to breathe.  You feel like your heart is beating fast (palpitations).  You become dizzy or light-headed upon standing up. SEEK IMMEDIATE MEDICAL CARE IF:   You have difficulty breathing.  There is a change in mental status such as decreased alertness or difficulty with concentration.  You have a pain or discomfort in your chest.  You have an  episode of fainting (syncope). MAKE SURE YOU:   Understand these instructions.  Will watch your condition.  Will get help right away if you are not doing well or get worse. Document Released: 11/24/2005 Document Revised: 04/10/2014 Document Reviewed: 12/24/2012 [facility] Patient Information 2015 [place], [place]. This information is not intended to replace advice given to you by your health care provider. Make sure you discuss any questions you have with your health care provider.

## 2014-12-14 NOTE — Telephone Encounter (Signed)
PT'S HUSBAND CALLING  WOULD LIKE  PT  BE  SEEN   EARLIER  THAN  12-21-14  AS  SCHEDULED WITH DR Rayann Heman   EXPLAINED THAT  DR Johnsie Cancel  IS NOT  HERE  UNTIL   12-19-14  WILL FORWARD  TO DR Alvan Dame  FOR  REVIEW .Adonis Housekeeper

## 2014-12-14 NOTE — ED Notes (Signed)
C/o sob x 1 week. Dx recently with a-fib. No c/p. States she gets sob taking a shower and doing normal things.

## 2014-12-14 NOTE — ED Provider Notes (Signed)
CSN: [id number]     Arrival date & time 12/14/14  1015 History   First MD Initiated Contact with Patient 12/14/14 1035     Chief Complaint  Patient presents with  . Shortness of Breath     (Consider location/radiation/quality/duration/timing/severity/associated sxs/prior Treatment) HPI The patient has had approximately 2 weeks of incremental shortness of breath and mild cough. She reports she had very mild cough in association with this without any sputum production or fever. About a week ago the shortness of breath increased. She was seen by her family doctor 2 days ago and diagnosed with A. fib. She was started on Xarelto and metoprolol. The patient was referred by her family physician to the emergency department for further evaluation today. The patient reports she had some increasing shortness of breath. Her companion notes that she was getting short of breath with regular household activities like going to the bathroom. The patient reports that it was not that bad. She denies any chest pain, lightheadedness or near-syncope. She reports she feels fine now. Past Medical History  Diagnosis Date  . 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 HTN (hypertension)   . Hyperlipidemia   . PVD (peripheral vascular disease)     saw Dr Albertine Patricia 2007, severe but Asx R Common Iliac artery stenosis, u/s showed no AAA  . Uterine cancer 90s  . Breast CA     h/o , bilateral  . Atrial fibrillation    Past Surgical History  Procedure Laterality Date  . Mastectomy  2007    bilateral  . Total abdominal hysterectomy  90    abdominal ? vaginal?    No family history on file. History  Substance Use Topics  . Smoking status: Never Smoker   . Smokeless tobacco: Never Used  . Alcohol Use: Yes     Comment: 2 glasses of wine daily   OB History    No data available     Review of Systems  10 Systems reviewed and are negative for acute change except as noted in the  HPI.   Allergies  Review of patient's allergies indicates no known allergies.  Home Medications   Prior to Admission medications   Medication Sig Start Date End Date Taking? Authorizing Provider  aspirin 81 MG tablet Take 81 mg by mouth daily.   Yes Historical Provider, MD  atorvastatin (LIPITOR) 20 MG tablet Take 1 tablet (20 mg total) by mouth at bedtime. 11/15/14  Yes Colon Branch, MD  levETIRAcetam (KEPPRA) 750 MG tablet Take 750 mg by mouth 2 (two) times daily.   Yes Historical Provider, MD  metoprolol tartrate (LOPRESSOR) 25 MG tablet Take 1 tablet (25 mg total) by mouth 2 (two) times daily. 12/12/14  Yes Colon Branch, MD  rivaroxaban (XARELTO) 20 MG TABS tablet Take 1 tablet (20 mg total) by mouth daily with supper. 12/12/14  Yes Colon Branch, MD  furosemide (LASIX) 20 MG tablet Take 1 tablet (20 mg total) by mouth daily. 12/14/14   Charlesetta Shanks, MD   BP 133/98 mmHg  Pulse 75  Resp 32  Ht 5\' 7"  (1.702 m)  Wt 185 lb (83.915 kg)  BMI 28.97 kg/m2  SpO2 100% Physical Exam  Constitutional: She is oriented to person, place, and time. She appears well-developed and well-nourished.  HENT:  Head: Normocephalic and atraumatic.  Eyes: EOM are normal. Pupils are equal, round, and reactive to light.  Neck: Neck supple.  Cardiovascular: Regular rhythm, normal heart sounds and intact distal pulses.   Irregularly irregular. Monitor shows rate of 70-80 with irregularly irregular narrow complex.  Pulmonary/Chest: Effort normal and breath sounds normal.  Abdominal: Soft. Bowel sounds are normal. She exhibits no distension. There is no tenderness.  Musculoskeletal: Normal range of motion. She exhibits no edema.  Neurological: She is alert and oriented to person, place, and time. She has normal strength. Coordination normal. GCS eye subscore is 4. GCS verbal subscore is 5. GCS motor subscore is 6.  Skin: Skin is warm, dry and intact.  Psychiatric: She has a normal mood and affect.    ED Course   Procedures (including critical care time) Labs Review Labs Reviewed  CBC - Abnormal; Notable for the following:    WBC 12.3 (*)    RBC 5.66 (*)    Hemoglobin 16.8 (*)    HCT 50.7 (*)    All other components within normal limits  APTT - Abnormal; Notable for the following:    aPTT 38 (*)    All other components within normal limits  BRAIN NATRIURETIC PEPTIDE - Abnormal; Notable for the following:    B Natriuretic Peptide 426.6 (*)    All other components within normal limits  COMPREHENSIVE METABOLIC PANEL - Abnormal; Notable for the following:    Glucose, Bld 126 (*)    AST 223 (*)    ALT 205 (*)    Alkaline Phosphatase 163 (*)    GFR calc non Af Amer 62 (*)    GFR calc Af Amer 72 (*)    All other components within normal limits  TROPONIN I    Imaging Review Dg Chest 2 View  12/14/2014   CLINICAL DATA:  Cough for 2 weeks.  EXAM: CHEST  2 VIEW  COMPARISON:  PA and lateral chest 12/12/2014. Single view of the chest 04/08/2006.  FINDINGS: The patient is status post bilateral mastectomy and axillary dissection. There are small bilateral pleural effusions and basilar atelectasis. Small effusions appear slightly increased since the prior examination. Heart size is upper normal but there is no pulmonary edema. No pneumothorax is identified.  IMPRESSION: Small bilateral pleural effusions and basilar atelectasis so slight increase since the study 2 days ago.  Cardiomegaly without edema.   Electronically Signed   By: Inge Rise M.D.   On: 12/14/2014 11:12   Dg Chest 2 View  12/12/2014   CLINICAL DATA:  Persistent cough, new onset atrial fibrillation, history hypertension, breast cancer post BILATERAL mastectomy, uterine cancer  EXAM: CHEST  2 VIEW  COMPARISON:  04/08/2006  FINDINGS: Mild enlargement of cardiac silhouette.  Atherosclerotic calcification aorta.  Mediastinal contours and pulmonary vascularity normal.  Prior BILATERAL breast reconstructions.  Bibasilar atelectasis versus  scarring.  No acute infiltrate, definite pleural effusion or pneumothorax.  Bones demineralized.  IMPRESSION: Enlargement of cardiac silhouette.  Bibasilar atelectasis versus scarring.   Electronically Signed   By: Lavonia Dana M.D.   On: 12/12/2014 18:20     EKG Interpretation   Date/Time:  Thursday December 14 2014 10:29:42 EST Ventricular Rate:  76 PR Interval:    QRS Duration: 102 QT Interval:  386 QTC Calculation: 434 R Axis:   -51 Text Interpretation:  Atrial fibrillation Incomplete right bundle branch  block Left anterior fascicular block Minimal voltage criteria for LVH, may  be normal variant Nonspecific T wave abnormality Abnormal ECG agree.  change from old, Confirmed by Johnney Killian, MD, Jeannie Done [phone]) on 12/14/2014  10:31:35 AM  Consultation with [place]. At this time with the patient rate controlled and minimally symptomatic with dyspnea, he did feel that her degree of congestive heart failure could be managed at home by initiating Lasix 20 mg per day. The patient is to be advised to increase her daily potassium intake through potassium-rich foods. She is to continue her Xarelto and metoprolol as prescribed. MDM   Final diagnoses:  Persistent atrial fibrillation  Dyspnea  Acute congestive heart failure, unspecified congestive heart failure type   The patient's symptoms appear to be secondary to congestive heart failure with new onset atrial fibrillation. Initial outpatient management had been done by her family physician with initiation of Xarelto and metoprolol. The patient reports that her symptoms are not that severe and she does not wish to be admitted to the hospital. Her wishes were to have consultation with cardiology and continue her management at home with close follow-up. I have reviewed the case with Dr.Croitoru. The patient is counseled that if her symptoms are progressing or worsening in any way or should she develop chest pain or lightheadedness she is to return  to the hospital for admission.    Charlesetta Shanks, MD 12/14/14 [phone]

## 2014-12-14 NOTE — Telephone Encounter (Signed)
thx

## 2014-12-14 NOTE — Telephone Encounter (Signed)
Pt was seen on 12/12/2014, with cough and congestion. Her heart was in the 130s and new onset of Atrial Fib was found. Pt given Xarelto 20 mg and referred to Cardio, first appt is 12/21/2014. Received MyChart message from Pts husband stating she is very fatigued and weak. Pt and husband returning today for lab redraw due to blood clotting in tubes, and wanted to know if she could be evaluated by someone, we do have an 11:30 today but spoke with Dr. Larose Kells and  Informed by him,  if she feels that bad she may need to go to ED.

## 2014-12-15 ENCOUNTER — Emergency Department (HOSPITAL_BASED_OUTPATIENT_CLINIC_OR_DEPARTMENT_OTHER): Payer: Medicare Other

## 2014-12-15 ENCOUNTER — Inpatient Hospital Stay (HOSPITAL_BASED_OUTPATIENT_CLINIC_OR_DEPARTMENT_OTHER)
Admission: EM | Admit: 2014-12-15 | Discharge: 2014-12-17 | DRG: 187 | Disposition: A | Discharge status: Home / Self Care | Payer: Medicare Other | Attending: Internal Medicine | Admitting: Internal Medicine

## 2014-12-15 ENCOUNTER — Encounter (HOSPITAL_BASED_OUTPATIENT_CLINIC_OR_DEPARTMENT_OTHER): Payer: Self-pay

## 2014-12-15 DIAGNOSIS — I4891 Unspecified atrial fibrillation: Secondary | ICD-10-CM

## 2014-12-15 DIAGNOSIS — G40109 Localization-related (focal) (partial) symptomatic epilepsy and epileptic syndromes with simple partial seizures, not intractable, without status epilepticus: Secondary | ICD-10-CM | POA: Diagnosis present

## 2014-12-15 DIAGNOSIS — Z9221 Personal history of antineoplastic chemotherapy: Secondary | ICD-10-CM

## 2014-12-15 DIAGNOSIS — K7460 Unspecified cirrhosis of liver: Secondary | ICD-10-CM | POA: Diagnosis present

## 2014-12-15 DIAGNOSIS — I708 Atherosclerosis of other arteries: Secondary | ICD-10-CM | POA: Diagnosis present

## 2014-12-15 DIAGNOSIS — Z7982 Long term (current) use of aspirin: Secondary | ICD-10-CM

## 2014-12-15 DIAGNOSIS — I481 Persistent atrial fibrillation: Secondary | ICD-10-CM | POA: Diagnosis present

## 2014-12-15 DIAGNOSIS — R002 Palpitations: Secondary | ICD-10-CM | POA: Diagnosis present

## 2014-12-15 DIAGNOSIS — I509 Heart failure, unspecified: Secondary | ICD-10-CM | POA: Diagnosis present

## 2014-12-15 DIAGNOSIS — C50919 Malignant neoplasm of unspecified site of unspecified female breast: Secondary | ICD-10-CM | POA: Diagnosis present

## 2014-12-15 DIAGNOSIS — Z853 Personal history of malignant neoplasm of breast: Secondary | ICD-10-CM

## 2014-12-15 DIAGNOSIS — R569 Unspecified convulsions: Secondary | ICD-10-CM

## 2014-12-15 DIAGNOSIS — E782 Mixed hyperlipidemia: Secondary | ICD-10-CM | POA: Diagnosis present

## 2014-12-15 DIAGNOSIS — I739 Peripheral vascular disease, unspecified: Secondary | ICD-10-CM | POA: Diagnosis present

## 2014-12-15 DIAGNOSIS — R0602 Shortness of breath: Secondary | ICD-10-CM | POA: Diagnosis present

## 2014-12-15 DIAGNOSIS — R5383 Other fatigue: Secondary | ICD-10-CM | POA: Diagnosis present

## 2014-12-15 DIAGNOSIS — J90 Pleural effusion, not elsewhere classified: Principal | ICD-10-CM | POA: Diagnosis present

## 2014-12-15 DIAGNOSIS — Z7901 Long term (current) use of anticoagulants: Secondary | ICD-10-CM | POA: Diagnosis not present

## 2014-12-15 DIAGNOSIS — Z8542 Personal history of malignant neoplasm of other parts of uterus: Secondary | ICD-10-CM | POA: Diagnosis not present

## 2014-12-15 DIAGNOSIS — E785 Hyperlipidemia, unspecified: Secondary | ICD-10-CM | POA: Diagnosis present

## 2014-12-15 DIAGNOSIS — Z9013 Acquired absence of bilateral breasts and nipples: Secondary | ICD-10-CM | POA: Diagnosis present

## 2014-12-15 DIAGNOSIS — I10 Essential (primary) hypertension: Secondary | ICD-10-CM | POA: Diagnosis present

## 2014-12-15 DIAGNOSIS — Z79899 Other long term (current) drug therapy: Secondary | ICD-10-CM | POA: Diagnosis not present

## 2014-12-15 DIAGNOSIS — Z90710 Acquired absence of both cervix and uterus: Secondary | ICD-10-CM

## 2014-12-15 DIAGNOSIS — R945 Abnormal results of liver function studies: Secondary | ICD-10-CM | POA: Diagnosis present

## 2014-12-15 DIAGNOSIS — R740 Nonspecific elevation of levels of transaminase and lactic acid dehydrogenase [LDH]: Secondary | ICD-10-CM | POA: Diagnosis present

## 2014-12-15 DIAGNOSIS — I4819 Other persistent atrial fibrillation: Secondary | ICD-10-CM | POA: Diagnosis present

## 2014-12-15 DIAGNOSIS — R7989 Other specified abnormal findings of blood chemistry: Secondary | ICD-10-CM

## 2014-12-15 DIAGNOSIS — I350 Nonrheumatic aortic (valve) stenosis: Secondary | ICD-10-CM | POA: Diagnosis present

## 2014-12-15 DIAGNOSIS — K8020 Calculus of gallbladder without cholecystitis without obstruction: Secondary | ICD-10-CM | POA: Diagnosis present

## 2014-12-15 DIAGNOSIS — R0600 Dyspnea, unspecified: Secondary | ICD-10-CM | POA: Diagnosis present

## 2014-12-15 HISTORY — DX: Essential (primary) hypertension: I10

## 2014-12-15 HISTORY — DX: Nonrheumatic aortic (valve) stenosis: I35.0

## 2014-12-15 LAB — COMPREHENSIVE METABOLIC PANEL
ALK PHOS: 206 U/L — AB (ref 39–117)
ALT: 268 U/L — AB (ref 0–35)
AST: 241 U/L — AB (ref 0–37)
Albumin: 4.4 g/dL (ref 3.5–5.2)
Anion gap: 10 (ref 5–15)
BILIRUBIN TOTAL: 1.2 mg/dL (ref 0.3–1.2)
BUN: 23 mg/dL (ref 6–23)
CHLORIDE: 103 meq/L (ref 96–112)
CO2: 23 mmol/L (ref 19–32)
Calcium: 8.8 mg/dL (ref 8.4–10.5)
Creatinine, Ser: 1.21 mg/dL — ABNORMAL HIGH (ref 0.50–1.10)
GFR calc Af Amer: 49 mL/min — ABNORMAL LOW (ref >90)
GFR, EST NON AFRICAN AMERICAN: 42 mL/min — AB (ref >90)
Glucose, Bld: 122 mg/dL — ABNORMAL HIGH (ref 70–99)
Potassium: 3.5 mmol/L (ref 3.5–5.1)
Sodium: 136 mmol/L (ref 135–145)
TOTAL PROTEIN: 7.4 g/dL (ref 6.0–8.3)

## 2014-12-15 LAB — URINALYSIS, ROUTINE W REFLEX MICROSCOPIC
BILIRUBIN URINE: NEGATIVE
GLUCOSE, UA: NEGATIVE mg/dL
KETONES UR: NEGATIVE mg/dL
LEUKOCYTES UA: NEGATIVE
Nitrite: NEGATIVE
Protein, ur: NEGATIVE mg/dL
Specific Gravity, Urine: 1.014 (ref 1.005–1.030)
UROBILINOGEN UA: 1 mg/dL (ref 0.0–1.0)
pH: 5 (ref 5.0–8.0)

## 2014-12-15 LAB — URINE MICROSCOPIC-ADD ON

## 2014-12-15 LAB — CBC WITH DIFFERENTIAL/PLATELET
BASOS PCT: 0 % (ref 0–1)
Basophils Absolute: 0 10*3/uL (ref 0.0–0.1)
Eosinophils Absolute: 0.1 10*3/uL (ref 0.0–0.7)
Eosinophils Relative: 1 % (ref 0–5)
HEMATOCRIT: 47.8 % — AB (ref 36.0–46.0)
Hemoglobin: 16 g/dL — ABNORMAL HIGH (ref 12.0–15.0)
Lymphocytes Relative: 14 % (ref 12–46)
Lymphs Abs: 1.6 10*3/uL (ref 0.7–4.0)
MCH: 29.9 pg (ref 26.0–34.0)
MCHC: 33.5 g/dL (ref 30.0–36.0)
MCV: 89.3 fL (ref 78.0–100.0)
MONO ABS: 1.9 10*3/uL — AB (ref 0.1–1.0)
Monocytes Relative: 16 % — ABNORMAL HIGH (ref 3–12)
NEUTROS PCT: 69 % (ref 43–77)
Neutro Abs: 7.9 10*3/uL — ABNORMAL HIGH (ref 1.7–7.7)
Platelets: 230 10*3/uL (ref 150–400)
RBC: 5.35 MIL/uL — AB (ref 3.87–5.11)
RDW: 12.9 % (ref 11.5–15.5)
WBC: 11.5 10*3/uL — ABNORMAL HIGH (ref 4.0–10.5)

## 2014-12-15 LAB — BRAIN NATRIURETIC PEPTIDE: B Natriuretic Peptide: 345.9 pg/mL — ABNORMAL HIGH (ref 0.0–100.0)

## 2014-12-15 LAB — TROPONIN I
Troponin I: <0.03 ng/mL (ref <0.031)
Troponin I: <0.03 ng/mL (ref <0.031)

## 2014-12-15 MED ORDER — SODIUM CHLORIDE 0.9 % IJ SOLN
3.0000 mL | Freq: Two times a day (BID) | INTRAMUSCULAR | Status: DC
  Administered 2014-12-16: 3 mL via INTRAVENOUS

## 2014-12-15 MED ORDER — SODIUM CHLORIDE 0.9 % IJ SOLN: 3.0000 mL | INTRAMUSCULAR | Status: DC | PRN

## 2014-12-15 MED ORDER — METOPROLOL TARTRATE 25 MG PO TABS
25.0000 mg | ORAL_TABLET | Freq: Two times a day (BID) | ORAL | Status: DC
  Administered 2014-12-16 – 2014-12-17 (×3): 25 mg via ORAL
  Filled 2014-12-15 (×5): qty 1

## 2014-12-15 MED ORDER — ALUM & MAG HYDROXIDE-SIMETH 200-200-20 MG/5ML PO SUSP: 30.0000 mL | Freq: Four times a day (QID) | ORAL | Status: DC | PRN

## 2014-12-15 MED ORDER — HYDROMORPHONE HCL 1 MG/ML IJ SOLN: 0.5000 mg | INTRAMUSCULAR | Status: DC | PRN

## 2014-12-15 MED ORDER — SODIUM CHLORIDE 0.9 % IV SOLN: 250.0000 mL | INTRAVENOUS | Status: DC | PRN

## 2014-12-15 MED ORDER — RIVAROXABAN 20 MG PO TABS
20.0000 mg | ORAL_TABLET | Freq: Every day | ORAL | Status: DC
  Administered 2014-12-16: 20 mg via ORAL
  Filled 2014-12-15 (×2): qty 1

## 2014-12-15 MED ORDER — SODIUM CHLORIDE 0.9 % IJ SOLN: 3.0000 mL | Freq: Two times a day (BID) | INTRAMUSCULAR | Status: DC

## 2014-12-15 MED ORDER — POTASSIUM CHLORIDE CRYS ER 10 MEQ PO TBCR
10.0000 meq | EXTENDED_RELEASE_TABLET | Freq: Two times a day (BID) | ORAL | Status: DC
  Administered 2014-12-15 – 2014-12-16 (×3): 10 meq via ORAL
  Filled 2014-12-15 (×4): qty 1

## 2014-12-15 MED ORDER — LISINOPRIL 2.5 MG PO TABS
2.5000 mg | ORAL_TABLET | Freq: Every day | ORAL | Status: DC
  Administered 2014-12-16 – 2014-12-17 (×2): 2.5 mg via ORAL
  Filled 2014-12-15 (×2): qty 1

## 2014-12-15 MED ORDER — ACETAMINOPHEN 325 MG PO TABS: 650.0000 mg | ORAL_TABLET | Freq: Four times a day (QID) | ORAL | Status: DC | PRN

## 2014-12-15 MED ORDER — LEVETIRACETAM 750 MG PO TABS
750.0000 mg | ORAL_TABLET | Freq: Two times a day (BID) | ORAL | Status: DC
  Administered 2014-12-16 – 2014-12-17 (×3): 750 mg via ORAL
  Filled 2014-12-15 (×4): qty 1

## 2014-12-15 MED ORDER — OXYCODONE HCL 5 MG PO TABS: 5.0000 mg | ORAL_TABLET | ORAL | Status: DC | PRN

## 2014-12-15 MED ORDER — ATORVASTATIN CALCIUM 20 MG PO TABS
20.0000 mg | ORAL_TABLET | Freq: Every day | ORAL | Status: DC
  Administered 2014-12-15: 20 mg via ORAL
  Filled 2014-12-15 (×2): qty 1

## 2014-12-15 MED ORDER — ONDANSETRON HCL 4 MG/2ML IJ SOLN
4.0000 mg | Freq: Four times a day (QID) | INTRAMUSCULAR | Status: DC | PRN
Start: 2014-12-15 — End: 2014-12-17

## 2014-12-15 MED ORDER — FUROSEMIDE 10 MG/ML IJ SOLN
20.0000 mg | Freq: Two times a day (BID) | INTRAMUSCULAR | Status: DC
  Administered 2014-12-15 – 2014-12-16 (×3): 20 mg via INTRAVENOUS
  Filled 2014-12-15 (×4): qty 2

## 2014-12-15 MED ORDER — SODIUM CHLORIDE 0.9 % IJ SOLN
3.0000 mL | Freq: Two times a day (BID) | INTRAMUSCULAR | Status: DC
  Administered 2014-12-15 – 2014-12-16 (×2): 3 mL via INTRAVENOUS

## 2014-12-15 MED ORDER — ONDANSETRON HCL 4 MG PO TABS: 4.0000 mg | ORAL_TABLET | Freq: Four times a day (QID) | ORAL | Status: DC | PRN

## 2014-12-15 MED ORDER — ASPIRIN EC 81 MG PO TBEC
81.0000 mg | DELAYED_RELEASE_TABLET | Freq: Every day | ORAL | Status: DC
  Administered 2014-12-16 – 2014-12-17 (×2): 81 mg via ORAL
  Filled 2014-12-15 (×2): qty 1

## 2014-12-15 MED ORDER — ACETAMINOPHEN 650 MG RE SUPP: 650.0000 mg | Freq: Four times a day (QID) | RECTAL | Status: DC | PRN

## 2014-12-15 NOTE — H&P (Signed)
[facility] Admission History and Physical       [name] EPP:[id number] DOB: Oct 06, 1935 DOA: 12/15/2014  Referring physician: EDP PCP: Kathlene November, MD  Specialists:   Chief Complaint: SOB  HPI: [name] is a 80 y.o. female with diagnosis of A.Fib 3 days ago and placed on Lopressor and Xarelto by PCP who presented to the [facility] ED with complaints of worsening SOB and exhaustion over the past 2 days.    She was evaluated at the [facility] ED and was found to have bilateral pleural effusions, and elevated Tranasaminases.  An Ultrasound of the ABD was performed which revealed 2 non-obstructing Gallstones.  She was referred for admission and further workup at [facility].     Review of Systems:  Constitutional: No Weight Loss, No Weight Gain, Night Sweats, Fevers, Chills, Dizziness, Fatigue, +Generalized Weakness HEENT: No Headaches, Difficulty Swallowing,Tooth/Dental Problems,Sore Throat,  No Sneezing, Rhinitis, Ear Ache, Nasal Congestion, or Post Nasal Drip,  Cardio-vascular:  No Chest pain, Orthopnea, PND, Edema in Lower Extremities, Anasarca, Dizziness, Palpitations  Resp: +Dyspnea, +DOE, No Productive Cough, No Non-Productive Cough, No Hemoptysis, No Wheezing.    GI: No Heartburn, Indigestion, Abdominal Pain, Nausea, Vomiting, Diarrhea, Hematemesis, Hematochezia, Melena, Change in Bowel Habits,  Loss of Appetite  GU: No Dysuria, Change in Color of Urine, No Urgency or Frequency, No Flank pain.  Musculoskeletal: No Joint Pain or Swelling, No Decreased Range of Motion, No Back Pain.  Neurologic: No Syncope, +Seizure History,  Muscle Weakness, Paresthesia, Vision Disturbance or Loss, No Diplopia, No Vertigo, No Difficulty Walking,  Skin: No Rash or Lesions. Psych: No Change in Mood or Affect, No Depression or Anxiety, No Memory loss, No Confusion, or Hallucinations   Past Medical History  Diagnosis Date  . 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 HTN  (hypertension)   . Hyperlipidemia   . PVD (peripheral vascular disease)     saw Dr Albertine Patricia 2007, severe but Asx R Common Iliac artery stenosis, u/s showed no AAA  . Uterine cancer 90s  . Breast CA     h/o , bilateral  . Atrial fibrillation       Past Surgical History  Procedure Laterality Date  . Mastectomy  2007    bilateral  . Total abdominal hysterectomy  90    abdominal ? vaginal?        Prior to Admission medications   Medication Sig Start Date End Date Taking? Authorizing Provider  aspirin 81 MG tablet Take 81 mg by mouth daily.    Historical Provider, MD  atorvastatin (LIPITOR) 20 MG tablet Take 1 tablet (20 mg total) by mouth at bedtime. 11/15/14   Colon Branch, MD  furosemide (LASIX) 20 MG tablet Take 1 tablet (20 mg total) by mouth daily. 12/14/14   Charlesetta Shanks, MD  levETIRAcetam (KEPPRA) 750 MG tablet Take 750 mg by mouth 2 (two) times daily.    Historical Provider, MD  metoprolol tartrate (LOPRESSOR) 25 MG tablet Take 1 tablet (25 mg total) by mouth 2 (two) times daily. 12/12/14   Colon Branch, MD  rivaroxaban (XARELTO) 20 MG TABS tablet Take 1 tablet (20 mg total) by mouth daily with supper. 12/12/14   Colon Branch, MD      No Known Allergies   Social History:  reports that she has never smoked. She has never used smokeless tobacco. She reports that she drinks alcohol. She reports that she does  not use illicit drugs.     No family history on file.     Physical Exam:  GEN:  Pleasant Well Developed Elderly  80 y.o. Caucasian female  examined  and in no acute distress; cooperative with exam Filed Vitals:   12/15/14 1830 12/15/14 1900 12/15/14 1924 12/15/14 2033  BP: 159/81 142/62 158/66   Pulse: 77 65 72   Resp: 18  22   Height:    5\' 7"  (1.702 m)  Weight:    80.015 kg (176 lb 6.4 oz)  SpO2: 99% 100% 100%    Blood pressure 158/66, pulse 72, resp. rate 22, height 5\' 7"  (1.702 m), weight 80.015 kg (176 lb 6.4 oz), SpO2 100 %. PSYCH: SHe is alert and oriented x4;  does not appear anxious does not appear depressed; affect is normal HEENT: Normocephalic and Atraumatic, Mucous membranes pink; PERRLA; EOM intact; Fundi:  Benign;  No scleral icterus, Nares: Patent, Oropharynx: Clear, Fair Dentition,    Neck:  FROM, No Cervical Lymphadenopathy nor Thyromegaly or Carotid Bruit; No JVD; Breasts:: Not examined CHEST WALL: No tenderness CHEST: Normal respiration, clear to auscultation bilaterally HEART: Irregular rate and rhythm; no murmurs rubs or gallops BACK: No kyphosis or scoliosis; No CVA tenderness ABDOMEN: Positive Bowel Sounds, Obese, Soft Non-Tender; No Masses, No Organomegaly. Rectal Exam: Not done EXTREMITIES: No Cyanosis, Clubbing, or Edema; No Ulcerations. Genitalia: not examined PULSES: 2+ and symmetric SKIN: Normal hydration no rash or ulceration CNS:  Alert and Oriented x 4, No Focal Deficits Vascular: pulses palpable throughout    Labs on Admission:  Basic Metabolic Panel:  Recent Labs Lab 12/12/14 1553 12/14/14 1125 12/15/14 1605  NA 139 135 136  K 4.7 3.5 3.5  CL 105 105 103  CO2 22 19 23   GLUCOSE 100* 126* 122*  BUN 13 22 23   CREATININE 0.9 0.88 1.21*  CALCIUM 9.2 8.9 8.8  MG 2.2  --   --    Liver Function Tests:  Recent Labs Lab 12/12/14 1553 12/14/14 1125 12/15/14 1605  AST 38* 223* 241*  ALT 34 205* 268*  ALKPHOS 106 163* 206*  BILITOT 1.7* 1.2 1.2  PROT 7.5 6.9 7.4  ALBUMIN 4.5 4.2 4.4   No results for input(s): LIPASE, AMYLASE in the last 168 hours. No results for input(s): AMMONIA in the last 168 hours. CBC:  Recent Labs Lab 12/12/14 1553 12/14/14 1040 12/15/14 1605  WBC 9.5 12.3* 11.5*  NEUTROABS 7.5  --  7.9*  HGB 15.1* 16.8* 16.0*  HCT 46.7* 50.7* 47.8*  MCV 92.0 89.6 89.3  PLT 232.0 261 230   Cardiac Enzymes:  Recent Labs Lab 12/14/14 1125 12/15/14 1605  TROPONINI <0.03 <0.03    BNP (last 3 results) No results for input(s): PROBNP in the last 8760 hours. CBG: No results for  input(s): GLUCAP in the last 168 hours.  Radiological Exams on Admission: Dg Chest 2 View  12/15/2014   CLINICAL DATA:  80 year old female with increasing shortness of breath for several days. Cough. Initial encounter.  EXAM: CHEST  2 VIEW  COMPARISON:  12/14/2014 and earlier.  FINDINGS: Continued opacity at both lung bases suggesting pleural effusions. No interval progression. Stable lung volumes. No pneumothorax, pulmonary edema, or new pulmonary opacity. Stable cardiac size and mediastinal contours. Postoperative changes to the bilateral axilla and chest wall. No acute osseous abnormality identified.  IMPRESSION: Stable bilateral pleural effusions. No new cardiopulmonary abnormality.   Electronically Signed   By: Lars Pinks M.D.   On: 12/15/2014  16:20   Dg Chest 2 View  12/14/2014   CLINICAL DATA:  Cough for 2 weeks.  EXAM: CHEST  2 VIEW  COMPARISON:  PA and lateral chest 12/12/2014. Single view of the chest 04/08/2006.  FINDINGS: The patient is status post bilateral mastectomy and axillary dissection. There are small bilateral pleural effusions and basilar atelectasis. Small effusions appear slightly increased since the prior examination. Heart size is upper normal but there is no pulmonary edema. No pneumothorax is identified.  IMPRESSION: Small bilateral pleural effusions and basilar atelectasis so slight increase since the study 2 days ago.  Cardiomegaly without edema.   Electronically Signed   By: Inge Rise M.D.   On: 12/14/2014 11:12   US Abdomen Complete  12/15/2014   CLINICAL DATA:  80 year old female with shortness breath, fatigue, cough for the past 2-4 weeks, and elevated liver function tests. Elevated white blood cell count.  EXAM: ULTRASOUND ABDOMEN COMPLETE  COMPARISON:  No priors.  FINDINGS: Gallbladder: 2 small echogenic foci with posterior acoustic shadowing in the neck of the gallbladder, compatible with small gallstones. Gallbladder appears only mildly distended. Gallbladder wall  appears slightly edematous, measuring up to 5 mm in thickness. No pericholecystic fluid. Per report from the sonographer, the patient did not exhibit a sonographic Murphy's sign on examination.  Common bile duct: Diameter: 2.9 mm in the porta hepatis.  Liver: Slight nodular contour of the liver. No focal lesion identified. Within normal limits in parenchymal echogenicity. Bidirectional flow noted in the portal vein.  IVC: No abnormality visualized.  Pancreas: Visualized portion unremarkable.  Spleen: Size and appearance within normal limits.  5.4 cm in length.  Right Kidney: Length: 11.3 cm. Echogenicity within normal limits. 1.0 x 1.0 x 0.9 cm hypoechoic lesion with increased through transmission in the upper pole, likely a tiny cyst. No hydronephrosis visualized.  Left Kidney: Length: 11.1 cm. Echogenicity within normal limits. No mass or hydronephrosis visualized.  Abdominal aorta: No aneurysm visualized.  Other findings: Bilateral pleural effusions.  IMPRESSION: 1. Slightly nodular contour of the liver with bidirectional flow on the portal vein. These findings may indicate underlying cirrhosis. 2. There are 2 small gallstones in the neck of the gallbladder. No definitive findings to suggest acute cholecystitis at this time. Mild gallbladder wall thickening may in part relate to under distention of the gallbladder, and could also be related to intrinsic hepatic disease. 3. Bilateral pleural effusions. 4. Small hypoechoic lesion in the upper pole of the right kidney is likely a small cyst.   Electronically Signed   By: Vinnie Langton M.D.   On: 12/15/2014 18:04     EKG: Independently reviewed.    Assessment/Plan:  80 y.o. female with  Principal Problem:   1.   SOB (shortness of breath)-  Due to Pleural Effusions vs CHF, vs A.Fib   Telemetry Monitoring   CHF Protocol ordered   O2 PRN   Monitor O2 Sats   Cycle Troponins   2D ECHO in AM      Active Problems:   2.   Pleural effusion- Transudative  vs Exudative Effusions   CHF Protocol ,Diurese with IV Lasix     3.   Persistent atrial fibrillation   Recently started Lopressor and Xarelto Rx     4.   Elevated LFTs- with Cirrhosis on [place] Study   Monitor LFTs   Check Hepatitis Panel     5.   Malignant neoplasm of female breast-    Hx of Chemo Rx  6.   Hyperlipidemia   On Atorvastatin Rx     7.   HTN (hypertension)   Continue Lopressor Rx   Monitor BPs     8.   Seizure History   Continue Keppra Rx     9.   Calculus of gallbladder without cholecystitis without obstruction   Asymptomatic    10.   DVT Prophylaxis   On Xarelto Rx     Code Status:    FULL CODE   Family Communication:   Husband and Daughters at Bedside Disposition Plan:  Inpatient       Time spent:  [facility] Pager [phone]   If [place] Please Contact the Day Rounding Team MD for [facility]  If 7PM-7AM, Please Contact Night-Floor Coverage  [email or web address] Password [email or web address] 12/15/2014, 10:07 PM      ADDENDUM:  Patient was seen and examined on 12/15/2014

## 2014-12-15 NOTE — ED Notes (Signed)
SHOB since yesterday. Worse than before. Weak and tired feeling today.

## 2014-12-15 NOTE — ED Provider Notes (Signed)
CSN: [id number]     Arrival date & time 12/15/14  1531 History   First MD Initiated Contact with Patient 12/15/14 1549     Chief Complaint  Patient presents with  . Shortness of Breath     (Consider location/radiation/quality/duration/timing/severity/associated sxs/prior Treatment) HPI Comments: Patient presents to the ER for evaluation of shortness of breath. Patient reports that she has been experiencing progressively worsening shortness of breath over a period of approximately 2 weeks. Patient reportedly saw her doctor 3 days ago and was diagnosed with atrial fibrillation. She was started on Xarelto and Lopressor. Patient continued to have symptoms, was seen in the ER yesterday. At that time it was felt that she might have congestive heart failure. She did not want to be admitted to the hospital, was discharged to follow-up with cardiology. Patient reports that she is experiencing more fatigue today returns expecting admission.  Patient is a 80 y.o. female presenting with shortness of breath.  Shortness of Breath   Past Medical History  Diagnosis Date  . 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 HTN (hypertension)   . Hyperlipidemia   . PVD (peripheral vascular disease)     saw Dr Albertine Patricia 2007, severe but Asx R Common Iliac artery stenosis, u/s showed no AAA  . Uterine cancer 90s  . Breast CA     h/o , bilateral  . Atrial fibrillation    Past Surgical History  Procedure Laterality Date  . Mastectomy  2007    bilateral  . Total abdominal hysterectomy  90    abdominal ? vaginal?    No family history on file. History  Substance Use Topics  . Smoking status: Never Smoker   . Smokeless tobacco: Never Used  . Alcohol Use: Yes     Comment: 2 glasses of wine daily   OB History    No data available     Review of Systems  Constitutional: Positive for fatigue.  Respiratory: Positive for shortness of breath.   All other systems reviewed and are  negative.     Allergies  Review of patient's allergies indicates no known allergies.  Home Medications   Prior to Admission medications   Medication Sig Start Date End Date Taking? Authorizing Provider  aspirin 81 MG tablet Take 81 mg by mouth daily.    Historical Provider, MD  atorvastatin (LIPITOR) 20 MG tablet Take 1 tablet (20 mg total) by mouth at bedtime. 11/15/14   Colon Branch, MD  furosemide (LASIX) 20 MG tablet Take 1 tablet (20 mg total) by mouth daily. 12/14/14   Charlesetta Shanks, MD  levETIRAcetam (KEPPRA) 750 MG tablet Take 750 mg by mouth 2 (two) times daily.    Historical Provider, MD  metoprolol tartrate (LOPRESSOR) 25 MG tablet Take 1 tablet (25 mg total) by mouth 2 (two) times daily. 12/12/14   Colon Branch, MD  rivaroxaban (XARELTO) 20 MG TABS tablet Take 1 tablet (20 mg total) by mouth daily with supper. 12/12/14   Colon Branch, MD   BP 151/81 mmHg  Pulse 52  Resp 20  SpO2 100% Physical Exam  Constitutional: She is oriented to person, place, and time. She appears well-developed and well-nourished. No distress.  HENT:  Head: Normocephalic and atraumatic.  Right Ear: Hearing normal.  Left Ear: Hearing normal.  Nose: Nose normal.  Mouth/Throat: Oropharynx is clear and moist and mucous membranes are normal.  Eyes: Conjunctivae and EOM are normal. Pupils  are equal, round, and reactive to light.  Neck: Normal range of motion. Neck supple.  Cardiovascular: S1 normal and S2 normal.  An irregularly irregular rhythm present. Exam reveals no gallop and no friction rub.   No murmur heard. Pulmonary/Chest: Effort normal and breath sounds normal. No respiratory distress. She exhibits no tenderness.  Abdominal: Soft. Normal appearance and bowel sounds are normal. There is no hepatosplenomegaly. There is no tenderness. There is no rebound, no guarding, no tenderness at McBurney's point and negative Murphy's sign. No hernia.  Musculoskeletal: Normal range of motion.  Neurological: She is  alert and oriented to person, place, and time. She has normal strength. No cranial nerve deficit or sensory deficit. Coordination normal. GCS eye subscore is 4. GCS verbal subscore is 5. GCS motor subscore is 6.  Skin: Skin is warm, dry and intact. No rash noted. No cyanosis.  Psychiatric: She has a normal mood and affect. Her speech is normal and behavior is normal. Thought content normal.  Nursing note and vitals reviewed.   ED Course  Procedures (including critical care time) Labs Review Labs Reviewed  CBC WITH DIFFERENTIAL - Abnormal; Notable for the following:    WBC 11.5 (*)    RBC 5.35 (*)    Hemoglobin 16.0 (*)    HCT 47.8 (*)    Neutro Abs 7.9 (*)    Monocytes Relative 16 (*)    Monocytes Absolute 1.9 (*)    All other components within normal limits  COMPREHENSIVE METABOLIC PANEL - Abnormal; Notable for the following:    Glucose, Bld 122 (*)    Creatinine, Ser 1.21 (*)    AST 241 (*)    ALT 268 (*)    Alkaline Phosphatase 206 (*)    GFR calc non Af Amer 42 (*)    GFR calc Af Amer 49 (*)    All other components within normal limits  BRAIN NATRIURETIC PEPTIDE - Abnormal; Notable for the following:    B Natriuretic Peptide 345.9 (*)    All other components within normal limits  TROPONIN I  URINALYSIS, ROUTINE W REFLEX MICROSCOPIC    Imaging Review Dg Chest 2 View  12/15/2014   CLINICAL DATA:  80 year old female with increasing shortness of breath for several days. Cough. Initial encounter.  EXAM: CHEST  2 VIEW  COMPARISON:  12/14/2014 and earlier.  FINDINGS: Continued opacity at both lung bases suggesting pleural effusions. No interval progression. Stable lung volumes. No pneumothorax, pulmonary edema, or new pulmonary opacity. Stable cardiac size and mediastinal contours. Postoperative changes to the bilateral axilla and chest wall. No acute osseous abnormality identified.  IMPRESSION: Stable bilateral pleural effusions. No new cardiopulmonary abnormality.   Electronically  Signed   By: Lars Pinks M.D.   On: 12/15/2014 16:20   Dg Chest 2 View  12/14/2014   CLINICAL DATA:  Cough for 2 weeks.  EXAM: CHEST  2 VIEW  COMPARISON:  PA and lateral chest 12/12/2014. Single view of the chest 04/08/2006.  FINDINGS: The patient is status post bilateral mastectomy and axillary dissection. There are small bilateral pleural effusions and basilar atelectasis. Small effusions appear slightly increased since the prior examination. Heart size is upper normal but there is no pulmonary edema. No pneumothorax is identified.  IMPRESSION: Small bilateral pleural effusions and basilar atelectasis so slight increase since the study 2 days ago.  Cardiomegaly without edema.   Electronically Signed   By: Inge Rise M.D.   On: 12/14/2014 11:12   US Abdomen Complete  12/15/2014   CLINICAL DATA:  80 year old female with shortness breath, fatigue, cough for the past 2-4 weeks, and elevated liver function tests. Elevated white blood cell count.  EXAM: ULTRASOUND ABDOMEN COMPLETE  COMPARISON:  No priors.  FINDINGS: Gallbladder: 2 small echogenic foci with posterior acoustic shadowing in the neck of the gallbladder, compatible with small gallstones. Gallbladder appears only mildly distended. Gallbladder wall appears slightly edematous, measuring up to 5 mm in thickness. No pericholecystic fluid. Per report from the sonographer, the patient did not exhibit a sonographic Murphy's sign on examination.  Common bile duct: Diameter: 2.9 mm in the porta hepatis.  Liver: Slight nodular contour of the liver. No focal lesion identified. Within normal limits in parenchymal echogenicity. Bidirectional flow noted in the portal vein.  IVC: No abnormality visualized.  Pancreas: Visualized portion unremarkable.  Spleen: Size and appearance within normal limits.  5.4 cm in length.  Right Kidney: Length: 11.3 cm. Echogenicity within normal limits. 1.0 x 1.0 x 0.9 cm hypoechoic lesion with increased through transmission in the  upper pole, likely a tiny cyst. No hydronephrosis visualized.  Left Kidney: Length: 11.1 cm. Echogenicity within normal limits. No mass or hydronephrosis visualized.  Abdominal aorta: No aneurysm visualized.  Other findings: Bilateral pleural effusions.  IMPRESSION: 1. Slightly nodular contour of the liver with bidirectional flow on the portal vein. These findings may indicate underlying cirrhosis. 2. There are 2 small gallstones in the neck of the gallbladder. No definitive findings to suggest acute cholecystitis at this time. Mild gallbladder wall thickening may in part relate to under distention of the gallbladder, and could also be related to intrinsic hepatic disease. 3. Bilateral pleural effusions. 4. Small hypoechoic lesion in the upper pole of the right kidney is likely a small cyst.   Electronically Signed   By: Vinnie Langton M.D.   On: 12/15/2014 18:04     EKG Interpretation None      MDM   Final diagnoses:  Shortness of breath  Elevated LFTs  Bilateral pleural effusion  Persistent atrial fibrillation  Calculus of gallbladder without cholecystitis without obstruction    Patient presents to the ER with progressively worsening shortness of breath and generalized fatigue. Patient's medical history has been complex recently. Patient was experiencing progressively worsening shortness of breath for a period of 2 weeks when she saw her doctor 3 days ago was found to be in atrial fibrillation. She was started on Lopressor and Xarelto. She continued to be symptomatic, was seen in the ER yesterday. It was felt that she had some volume overload at that time, declined admission to the hospital. She went home yesterday, but continued to feel very weak and short of breath today, returns for repeat evaluation.  Chest x-ray shows persistent and enlarged bilateral pleural effusions. Her LFTs are elevated as well. They were very slightly elevated 3 days ago, yesterday were elevated further and has  progressed today. Based on this, ultrasound was performed. She does have 2 small gallstones in the gallbladder. She also, however, has evidence of underlying cirrhosis. She admits to drinking a glass of wine daily but has not had a problem with alcohol chronically.  Patient will require hospitalization for further evaluation of elevated LFTs and liver abnormality. Additionally she will require further treatment for her atrial fibrillation, although she is rate controlled currently. Bilateral pleural effusions might be secondary to congestive heart failure, although BNP is not significantly elevated. She does have a history of breast cancer, would require further evaluation for potential  underlying cancer as a cause of these effusions.    Orpah Greek, MD 12/15/14 [phone]

## 2014-12-15 NOTE — ED Notes (Signed)
Pt did sign EMTALA e-signature-computer fail and locked

## 2014-12-15 NOTE — ED Notes (Signed)
Pt with c/o increasing SOB-RT in with pt-sats 95%-placed on O2 2 L [place]

## 2014-12-15 NOTE — Telephone Encounter (Signed)
She needs echo for AS and can see flex PA

## 2014-12-16 ENCOUNTER — Encounter (HOSPITAL_COMMUNITY): Payer: Self-pay | Admitting: Cardiology

## 2014-12-16 DIAGNOSIS — I369 Nonrheumatic tricuspid valve disorder, unspecified: Secondary | ICD-10-CM

## 2014-12-16 DIAGNOSIS — E785 Hyperlipidemia, unspecified: Secondary | ICD-10-CM

## 2014-12-16 LAB — BASIC METABOLIC PANEL
Anion gap: 14 (ref 5–15)
BUN: 18 mg/dL (ref 6–23)
CALCIUM: 8.4 mg/dL (ref 8.4–10.5)
CO2: 22 mmol/L (ref 19–32)
Chloride: 102 mEq/L (ref 96–112)
Creatinine, Ser: 1.09 mg/dL (ref 0.50–1.10)
GFR calc Af Amer: 55 mL/min — ABNORMAL LOW (ref >90)
GFR, EST NON AFRICAN AMERICAN: 48 mL/min — AB (ref >90)
Glucose, Bld: 99 mg/dL (ref 70–99)
Potassium: 3.4 mmol/L — ABNORMAL LOW (ref 3.5–5.1)
Sodium: 138 mmol/L (ref 135–145)

## 2014-12-16 LAB — CBC
HEMATOCRIT: 41.7 % (ref 36.0–46.0)
Hemoglobin: 13.8 g/dL (ref 12.0–15.0)
MCH: 29.3 pg (ref 26.0–34.0)
MCHC: 33.1 g/dL (ref 30.0–36.0)
MCV: 88.5 fL (ref 78.0–100.0)
Platelets: 204 10*3/uL (ref 150–400)
RBC: 4.71 MIL/uL (ref 3.87–5.11)
RDW: 13.1 % (ref 11.5–15.5)
WBC: 8.6 10*3/uL (ref 4.0–10.5)

## 2014-12-16 LAB — TROPONIN I: Troponin I: 0.03 ng/mL (ref <0.031)

## 2014-12-16 LAB — HEPATITIS PANEL, ACUTE
HCV AB: NEGATIVE
HEP A IGM: NONREACTIVE
Hep B C IgM: NONREACTIVE
Hepatitis B Surface Ag: NEGATIVE

## 2014-12-16 LAB — BRAIN NATRIURETIC PEPTIDE: B NATRIURETIC PEPTIDE 5: 378.3 pg/mL — AB (ref 0.0–100.0)

## 2014-12-16 LAB — GLUCOSE, CAPILLARY: GLUCOSE-CAPILLARY: 107 mg/dL — AB (ref 70–99)

## 2014-12-16 MED ORDER — HYDROCODONE-HOMATROPINE 5-1.5 MG/5ML PO SYRP
5.0000 mL | ORAL_SOLUTION | ORAL | Status: DC | PRN
  Administered 2014-12-16 – 2014-12-17 (×2): 5 mL via ORAL
  Filled 2014-12-16 (×2): qty 5

## 2014-12-16 MED ORDER — BENZONATATE 100 MG PO CAPS
100.0000 mg | ORAL_CAPSULE | Freq: Three times a day (TID) | ORAL | Status: DC
  Administered 2014-12-16 – 2014-12-17 (×3): 100 mg via ORAL
  Filled 2014-12-16 (×6): qty 1

## 2014-12-16 MED ORDER — MENTHOL 3 MG MT LOZG
1.0000 | LOZENGE | OROMUCOSAL | Status: DC | PRN
  Filled 2014-12-16: qty 9

## 2014-12-16 MED ORDER — LEVALBUTEROL HCL 0.63 MG/3ML IN NEBU
0.6300 mg | INHALATION_SOLUTION | Freq: Once | RESPIRATORY_TRACT | Status: AC
  Administered 2014-12-16: 0.63 mg via RESPIRATORY_TRACT
  Filled 2014-12-16: qty 3

## 2014-12-16 MED ORDER — POTASSIUM CHLORIDE CRYS ER 20 MEQ PO TBCR
40.0000 meq | EXTENDED_RELEASE_TABLET | Freq: Once | ORAL | Status: AC
  Administered 2014-12-16: 40 meq via ORAL
  Filled 2014-12-16: qty 2

## 2014-12-16 NOTE — Consult Note (Signed)
Primary cardiologist: Dr. Jenkins Rouge Consulting cardiologist: Dr. Satira Sark  Reason for consultation: Newly documented atrial fibrillation and possible congestive heart failure  Clinical Summary [name] is a 80 y.o.female past medical history outlined below, currently admitted to the hospital with worsening shortness of breath and fatigue over the last several weeks. She was recently diagnosed with atrial fibrillation by Dr. Larose Kells during office visit and was started on Xarelto as well as metoprolol. She was scheduled to see Dr. Johnsie Cancel back in the office, but came in to the hospital with worsening symptoms.  In speaking with the patient and also her husband by phone, it seems that she has been having these symptoms over the last 2 months, although specifically no palpitations or chest pain. She has had a cough mainly at night time. No abdominal pain, postprandial discomfort, or changes in bowel habits.  She has a history of mild aortic stenosis by echocardiogram in 2014 with preserved LVEF at that time. Current cardiac markers argue against ACS. BNP increased in 300-400 range, small bilateral pleural effusions noted by chest x-ray.  CHADSVASC score is 5 at this point (heart failure excluded pending assessment of LVEF).  Incidentally noted are elevations in transaminases with AST up to 241 and ALT up to 268. Alkaline phosphatase 206 and biliruben normal. Abdominal ultrasound results are noted below, no obvious acute findings. AST and ALT were recently normal suggesting acute event.  No Known Allergies  Medications Scheduled Medications: . aspirin EC  81 mg Oral Daily  . benzonatate  100 mg Oral TID  . furosemide  20 mg Intravenous Q12H  . levETIRAcetam  750 mg Oral BID  . lisinopril  2.5 mg Oral Daily  . metoprolol tartrate  25 mg Oral BID  . potassium chloride  10 mEq Oral BID  . rivaroxaban  20 mg Oral Q supper  . sodium chloride  3 mL Intravenous Q12H  . sodium chloride   3 mL Intravenous Q12H  . sodium chloride  3 mL Intravenous Q12H     PRN Medications: sodium chloride, sodium chloride, acetaminophen **OR** acetaminophen, alum & mag hydroxide-simeth, HYDROcodone-homatropine, HYDROmorphone (DILAUDID) injection, menthol-cetylpyridinium, ondansetron **OR** ondansetron (ZOFRAN) IV, oxyCODONE, sodium chloride, sodium chloride   Past Medical History  Diagnosis Date  . Epilepsy, focal August 2012    Initially diagnosed as TIA   . Essential hypertension   . Hyperlipidemia   . PVD (peripheral vascular disease)     Saw Dr Albertine Patricia 2007 - right common Iliac artery stenosis  . Uterine cancer   . Breast CA     Bilateral  . Atrial fibrillation     Recently diagnosed  . Aortic stenosis     Mild 2014     Past Surgical History  Procedure Laterality Date  . Mastectomy Bilateral 2007  . Total abdominal hysterectomy  1990s    Family History  Problem Relation Age of Onset  . Hypertension      Social History [name] reports that she has never smoked. She has never used smokeless tobacco. [name] reports that she drinks alcohol.  Review of Systems Complete review of systems negative except as otherwise outlined in the clinical summary and also the following. No fevers or chills. Reports stable appetite. No exertional chest pain. No leg edema or claudication.  Physical Examination Blood pressure 97/79, pulse 64, temperature 97.7 F (36.5 C), temperature source Oral, resp. rate 20, height 5\' 7"  (1.702 m), weight 182 lb 5.1 oz (82.7 kg), SpO2  95 %.  Intake/Output Summary (Last 24 hours) at 12/16/14 1500 Last data filed at 12/16/14 1300  Gross per 24 hour  Intake    180 ml  Output   1200 ml  Net  -1020 ml    Telemetry: Rate-controlled atrial fibrillation.  HEENT: Conjunctiva and lids normal, oropharynx clear. Neck: Supple, elevated JVP, no carotid bruits, no thyromegaly. Lungs: Diminished breath sounds at the bases, nonlabored breathing at  rest. Cardiac: Irregularly irregular, indistinct PMI, no S3, 2/6 systolic murmur, no pericardial rub. Abdomen: Soft, nontender, bowel sounds present, no guarding or rebound. Extremities: Trace edema, distal pulses 1-2+. Skin: Warm and dry. Musculoskeletal: No kyphosis. Neuropsychiatric: Alert and oriented x3, affect grossly appropriate.   Lab Results  Basic Metabolic Panel:  Recent Labs Lab 12/12/14 1553 12/14/14 1125 12/15/14 1605 12/16/14 0250  NA 139 135 136 138  K 4.7 3.5 3.5 3.4*  CL 105 105 103 102  CO2 22 19 23 22   GLUCOSE 100* 126* 122* 99  BUN 13 22 23 18   CREATININE 0.9 0.88 1.21* 1.09  CALCIUM 9.2 8.9 8.8 8.4  MG 2.2  --   --   --     Liver Function Tests:  Recent Labs Lab 12/12/14 1553 12/14/14 1125 12/15/14 1605  AST 38* 223* 241*  ALT 34 205* 268*  ALKPHOS 106 163* 206*  BILITOT 1.7* 1.2 1.2  PROT 7.5 6.9 7.4  ALBUMIN 4.5 4.2 4.4    CBC:  Recent Labs Lab 12/12/14 1553 12/14/14 1040 12/15/14 1605 12/16/14 0250  WBC 9.5 12.3* 11.5* 8.6  NEUTROABS 7.5  --  7.9*  --   HGB 15.1* 16.8* 16.0* 13.8  HCT 46.7* 50.7* 47.8* 41.7  MCV 92.0 89.6 89.3 88.5  PLT 232.0 261 230 204    Cardiac Enzymes:  Recent Labs Lab 12/14/14 1125 12/15/14 1605 12/15/14 2245 12/16/14 0250 12/16/14 0958  TROPONINI <0.03 <0.03 <0.03 <0.03 0.03    Pro-BNP: 378  ECG Tracing from January 7 showed rate-controlled atrial fibrillation with LVH, left anterior fascicular block, incomplete right bundle branch block, nonspecific ST-T changes.  Imaging EXAM: ULTRASOUND ABDOMEN COMPLETE  COMPARISON: No priors.  FINDINGS: Gallbladder: 2 small echogenic foci with posterior acoustic shadowing in the neck of the gallbladder, compatible with small gallstones. Gallbladder appears only mildly distended. Gallbladder wall appears slightly edematous, measuring up to 5 mm in thickness. No pericholecystic fluid. Per report from the sonographer, the patient did not  exhibit a sonographic Murphy's sign on examination.  Common bile duct: Diameter: 2.9 mm in the porta hepatis.  Liver: Slight nodular contour of the liver. No focal lesion identified. Within normal limits in parenchymal echogenicity. Bidirectional flow noted in the portal vein.  IVC: No abnormality visualized.  Pancreas: Visualized portion unremarkable.  Spleen: Size and appearance within normal limits. 5.4 cm in length.  Right Kidney: Length: 11.3 cm. Echogenicity within normal limits. 1.0 x 1.0 x 0.9 cm hypoechoic lesion with increased through transmission in the upper pole, likely a tiny cyst. No hydronephrosis visualized.  Left Kidney: Length: 11.1 cm. Echogenicity within normal limits. No mass or hydronephrosis visualized.  Abdominal aorta: No aneurysm visualized.  Other findings: Bilateral pleural effusions.  IMPRESSION: 1. Slightly nodular contour of the liver with bidirectional flow on the portal vein. These findings may indicate underlying cirrhosis. 2. There are 2 small gallstones in the neck of the gallbladder. No definitive findings to suggest acute cholecystitis at this time. Mild gallbladder wall thickening may in part relate to under distention of the  gallbladder, and could also be related to intrinsic hepatic disease. 3. Bilateral pleural effusions. 4. Small hypoechoic lesion in the upper pole of the right kidney is likely a small cyst.   EXAM: CHEST 2 VIEW  COMPARISON: 12/14/2014 and earlier.  FINDINGS: Continued opacity at both lung bases suggesting pleural effusions. No interval progression. Stable lung volumes. No pneumothorax, pulmonary edema, or new pulmonary opacity. Stable cardiac size and mediastinal contours. Postoperative changes to the bilateral axilla and chest wall. No acute osseous abnormality identified.  IMPRESSION: Stable bilateral pleural effusions. No new cardiopulmonary abnormality.  Impression  1. Newly diagnosed atrial  fibrillation by Dr. Renato Shin office visit on January 5, heart rate elevated at that point. Based on her symptoms, this could've been persisting for the last several weeks however. Heart rate is much better controlled now on metoprolol, and she was also already placed on Xarelto for stroke prophylaxis, CHADSVASC score of 5. She presents with heart failure symptoms and small bilateral pleural effusions, also transaminitis with question of hepatic congestion being raised. Follow-up echocardiogram is pending for reassessment of LVEF  2. Mild aortic stenosis as of 2014.  3. Peripheral arterial disease, right common iliac artery stenosis documented 2007.  4. Essential hypertension.  5. Mixed hyperlipidemia.  6. Prior history of uterine and breast cancer.   Recommendations  For now would continue Xarelto and current dose of metoprolol with good heart rate control in atrial fibrillation. Echocardiogram pending for assessment of LVEF as this may help to guide further medication adjustments. She may well have a tachycardia-mediated cardiomyopathy. Would also continue IV diuresis with improving symptoms. We will follow with you.  Satira Sark, M.D., F.A.C.C.

## 2014-12-16 NOTE — Progress Notes (Signed)
Nutrition Brief Note  Patient identified on the Malnutrition Screening Tool (MST) Report  Wt Readings from Last 15 Encounters:  12/16/14 182 lb 5.1 oz (82.7 kg)  12/14/14 185 lb (83.915 kg)  12/12/14 183 lb 4 oz (83.122 kg)  10/05/14 177 lb 6 oz (80.457 kg)  09/05/13 173 lb (78.472 kg)  07/28/13 174 lb 12.8 oz (79.289 kg)  07/14/13 178 lb 6.4 oz (80.922 kg)  12/28/12 182 lb (82.555 kg)  11/19/12 180 lb (81.647 kg)  08/26/12 183 lb (83.008 kg)  08/18/12 185 lb (83.915 kg)  09/09/11 180 lb (81.647 kg)  09/08/11 181 lb 8 oz (82.328 kg)  08/22/11 183 lb 3.2 oz (83.099 kg)  07/22/11 180 lb (81.647 kg)   [name] is a 79 y.o.female past medical history outlined below, currently admitted to the hospital with worsening shortness of breath and fatigue over the last several weeks. She was recently diagnosed with atrial fibrillation by Dr. Larose Kells during office visit and was started on Xarelto as well as metoprolol. She was scheduled to see Dr. Johnsie Cancel back in the office, but came in to the hospital with worsening symptoms.  Body mass index is 28.55 kg/(m^2). Patient meets criteria for overweight based on current BMI.   Current diet order is Heart Healthy, patient is consuming approximately 50% of meals at this time. Labs and medications reviewed.   No nutrition interventions warranted at this time. If nutrition issues arise, please consult RD.   [name] A. Jimmye Norman, RD, LDN, CDE Pager: [phone] After hours Pager: [phone]

## 2014-12-16 NOTE — Progress Notes (Signed)
  Echocardiogram 2D Echocardiogram has been performed.  [name] 12/16/2014, 11:54 AM

## 2014-12-16 NOTE — Progress Notes (Signed)
Patient ID: [name]  female  WJX:[id number]    DOB: 11/30/1935    DOA: 12/15/2014  PCP: Kathlene November, MD  Brief history of present illness  Patient is a 80 year old female with new diagnosis of atrial fibrillation 3 days ago, was placed on Lopressor and xarelto to by her PCP presented to ED with shortness of breath and increasing generalized fatigue over the past 2 days. Patient was found to have bilateral pleural effusion, elevated transaminases. Patient was sent to [facility] for further workup.  Assessment/Plan: Principal Problem:   SOB (shortness of breath) likely due to bilateral pleural effusions, from CHF from new onset atrial fibrillation - Currently improving with IV Lasix, net negative balance of 1.14 L since admission, continue strict I's and O's and daily weights - 2-D echo done, results pending - Troponins negative so far - Family reported that patient was supposed to see Dr. Johnsie Cancel, requested cardiology consult, called   Active Problems:    Hyperlipidemia -  On atorvastatin, hold due to transaminitis    HTN (hypertension) - Continue Lopressor     Seizure - Continue Keppra     Persistent atrial fibrillation - Rate controlled, continue xarelto     Elevated LFTs - Recheck LFTs in a.m., abdominal ultrasound showed 2 small gallstones without any cholecystitis, slight nodularity of the liver due to cirrhosis. No abdominal pain or tenderness. - Hold statins   DVT Prophylaxis: xarelto   Code Status: FC  Family Communication: Discussed in detail with patient's husband and daughter at the bedside  Disposition:  Consultants:  Cardiology- will call today  Procedures:  2-D echo  Antibiotics:  None    Subjective: Patient seen and examined, feeling better today, shortness of breath improving, no fevers or chills, cough+  Objective: Weight change:   Intake/Output Summary (Last 24 hours) at 12/16/14 1213 Last data filed at 12/16/14 0900  Gross  per 24 hour  Intake     60 ml  Output   1200 ml  Net  -1140 ml   Blood pressure 157/79, pulse 71, temperature 97.4 F (36.3 C), temperature source Oral, resp. rate 29, height 5\' 7"  (1.702 m), weight 82.7 kg (182 lb 5.1 oz), SpO2 97 %.  Physical Exam: General: Alert and awake, oriented x3, not in any acute distress. CVS: S1-S2 clear, no murmur rubs or gallops Chest: Decreased breath sounds at the bases Abdomen: soft nontender, nondistended, normal bowel sounds  Extremities: no cyanosis, clubbing or edema noted bilaterally Neuro: Cranial nerves II-XII intact, no focal neurological deficits  Lab Results: Basic Metabolic Panel:  Recent Labs Lab 12/12/14 1553  12/15/14 1605 12/16/14 0250  NA 139  < > 136 138  K 4.7  < > 3.5 3.4*  CL 105  < > 103 102  CO2 22  < > 23 22  GLUCOSE 100*  < > 122* 99  BUN 13  < > 23 18  CREATININE 0.9  < > 1.21* 1.09  CALCIUM 9.2  < > 8.8 8.4  MG 2.2  --   --   --   < > = values in this interval not displayed. Liver Function Tests:  Recent Labs Lab 12/14/14 1125 12/15/14 1605  AST 223* 241*  ALT 205* 268*  ALKPHOS 163* 206*  BILITOT 1.2 1.2  PROT 6.9 7.4  ALBUMIN 4.2 4.4   No results for input(s): LIPASE, AMYLASE in the last 168 hours. No results for input(s): AMMONIA in the last 168 hours. CBC:  Recent Labs Lab 12/15/14 1605 12/16/14 0250  WBC 11.5* 8.6  NEUTROABS 7.9*  --   HGB 16.0* 13.8  HCT 47.8* 41.7  MCV 89.3 88.5  PLT 230 204   Cardiac Enzymes:  Recent Labs Lab 12/15/14 2245 12/16/14 0250 12/16/14 0958  TROPONINI <0.03 <0.03 0.03   BNP: Invalid input(s): POCBNP CBG:  Recent Labs Lab 12/15/14 2318  GLUCAP 107*     Micro Results: No results found for this or any previous visit (from the past 240 hour(s)).  Studies/Results: Dg Chest 2 View  12/15/2014   CLINICAL DATA:  80 year old female with increasing shortness of breath for several days. Cough. Initial encounter.  EXAM: CHEST  2 VIEW  COMPARISON:   12/14/2014 and earlier.  FINDINGS: Continued opacity at both lung bases suggesting pleural effusions. No interval progression. Stable lung volumes. No pneumothorax, pulmonary edema, or new pulmonary opacity. Stable cardiac size and mediastinal contours. Postoperative changes to the bilateral axilla and chest wall. No acute osseous abnormality identified.  IMPRESSION: Stable bilateral pleural effusions. No new cardiopulmonary abnormality.   Electronically Signed   By: Lars Pinks M.D.   On: 12/15/2014 16:20   Dg Chest 2 View  12/14/2014   CLINICAL DATA:  Cough for 2 weeks.  EXAM: CHEST  2 VIEW  COMPARISON:  PA and lateral chest 12/12/2014. Single view of the chest 04/08/2006.  FINDINGS: The patient is status post bilateral mastectomy and axillary dissection. There are small bilateral pleural effusions and basilar atelectasis. Small effusions appear slightly increased since the prior examination. Heart size is upper normal but there is no pulmonary edema. No pneumothorax is identified.  IMPRESSION: Small bilateral pleural effusions and basilar atelectasis so slight increase since the study 2 days ago.  Cardiomegaly without edema.   Electronically Signed   By: Inge Rise M.D.   On: 12/14/2014 11:12   Dg Chest 2 View  12/12/2014   CLINICAL DATA:  Persistent cough, new onset atrial fibrillation, history hypertension, breast cancer post BILATERAL mastectomy, uterine cancer  EXAM: CHEST  2 VIEW  COMPARISON:  04/08/2006  FINDINGS: Mild enlargement of cardiac silhouette.  Atherosclerotic calcification aorta.  Mediastinal contours and pulmonary vascularity normal.  Prior BILATERAL breast reconstructions.  Bibasilar atelectasis versus scarring.  No acute infiltrate, definite pleural effusion or pneumothorax.  Bones demineralized.  IMPRESSION: Enlargement of cardiac silhouette.  Bibasilar atelectasis versus scarring.   Electronically Signed   By: Lavonia Dana M.D.   On: 12/12/2014 18:20   US Abdomen  Complete  12/15/2014   CLINICAL DATA:  80 year old female with shortness breath, fatigue, cough for the past 2-4 weeks, and elevated liver function tests. Elevated white blood cell count.  EXAM: ULTRASOUND ABDOMEN COMPLETE  COMPARISON:  No priors.  FINDINGS: Gallbladder: 2 small echogenic foci with posterior acoustic shadowing in the neck of the gallbladder, compatible with small gallstones. Gallbladder appears only mildly distended. Gallbladder wall appears slightly edematous, measuring up to 5 mm in thickness. No pericholecystic fluid. Per report from the sonographer, the patient did not exhibit a sonographic Murphy's sign on examination.  Common bile duct: Diameter: 2.9 mm in the porta hepatis.  Liver: Slight nodular contour of the liver. No focal lesion identified. Within normal limits in parenchymal echogenicity. Bidirectional flow noted in the portal vein.  IVC: No abnormality visualized.  Pancreas: Visualized portion unremarkable.  Spleen: Size and appearance within normal limits.  5.4 cm in length.  Right Kidney: Length: 11.3 cm. Echogenicity within normal limits. 1.0 x 1.0 x 0.9 cm  hypoechoic lesion with increased through transmission in the upper pole, likely a tiny cyst. No hydronephrosis visualized.  Left Kidney: Length: 11.1 cm. Echogenicity within normal limits. No mass or hydronephrosis visualized.  Abdominal aorta: No aneurysm visualized.  Other findings: Bilateral pleural effusions.  IMPRESSION: 1. Slightly nodular contour of the liver with bidirectional flow on the portal vein. These findings may indicate underlying cirrhosis. 2. There are 2 small gallstones in the neck of the gallbladder. No definitive findings to suggest acute cholecystitis at this time. Mild gallbladder wall thickening may in part relate to under distention of the gallbladder, and could also be related to intrinsic hepatic disease. 3. Bilateral pleural effusions. 4. Small hypoechoic lesion in the upper pole of the right kidney  is likely a small cyst.   Electronically Signed   By: Vinnie Langton M.D.   On: 12/15/2014 18:04    Medications: Scheduled Meds: . aspirin EC  81 mg Oral Daily  . atorvastatin  20 mg Oral QHS  . benzonatate  100 mg Oral TID  . furosemide  20 mg Intravenous Q12H  . levETIRAcetam  750 mg Oral BID  . lisinopril  2.5 mg Oral Daily  . metoprolol tartrate  25 mg Oral BID  . potassium chloride  10 mEq Oral BID  . rivaroxaban  20 mg Oral Q supper  . sodium chloride  3 mL Intravenous Q12H  . sodium chloride  3 mL Intravenous Q12H  . sodium chloride  3 mL Intravenous Q12H      LOS: 1 day   Laken Rog M.D. [facility] 12/16/2014, 12:13 PM Pager: [phone]  If 7PM-7AM, please contact night-coverage [email or web address] Password [email or web address]

## 2014-12-16 NOTE — Progress Notes (Signed)
Pt noted with Fine crackles breath sounds this am, Respiration was 36 and 29 on assessment. On call MD T. Rogue Bussing paged and notified, new orders received for breathing treatment. Respiratory Therapist paged, on coming RN notified in report.

## 2014-12-17 DIAGNOSIS — I350 Nonrheumatic aortic (valve) stenosis: Secondary | ICD-10-CM

## 2014-12-17 LAB — HEPATIC FUNCTION PANEL
ALBUMIN: 3.4 g/dL — AB (ref 3.5–5.2)
ALK PHOS: 147 U/L — AB (ref 39–117)
ALT: 163 U/L — ABNORMAL HIGH (ref 0–35)
AST: 115 U/L — ABNORMAL HIGH (ref 0–37)
BILIRUBIN DIRECT: 0.4 mg/dL — AB (ref 0.0–0.3)
BILIRUBIN INDIRECT: 0.8 mg/dL (ref 0.3–0.9)
Total Bilirubin: 1.2 mg/dL (ref 0.3–1.2)
Total Protein: 5.7 g/dL — ABNORMAL LOW (ref 6.0–8.3)

## 2014-12-17 LAB — BASIC METABOLIC PANEL
Anion gap: 8 (ref 5–15)
BUN: 21 mg/dL (ref 6–23)
CALCIUM: 8.5 mg/dL (ref 8.4–10.5)
CHLORIDE: 106 meq/L (ref 96–112)
CO2: 26 mmol/L (ref 19–32)
Creatinine, Ser: 1.02 mg/dL (ref 0.50–1.10)
GFR calc Af Amer: 60 mL/min — ABNORMAL LOW (ref >90)
GFR calc non Af Amer: 52 mL/min — ABNORMAL LOW (ref >90)
GLUCOSE: 105 mg/dL — AB (ref 70–99)
POTASSIUM: 3.9 mmol/L (ref 3.5–5.1)
Sodium: 140 mmol/L (ref 135–145)

## 2014-12-17 MED ORDER — BENZONATATE 100 MG PO CAPS: 100.0000 mg | ORAL_CAPSULE | Freq: Three times a day (TID) | ORAL | Status: DC

## 2014-12-17 MED ORDER — FUROSEMIDE 20 MG PO TABS
20.0000 mg | ORAL_TABLET | Freq: Every day | ORAL | Status: DC
  Administered 2014-12-17: 20 mg via ORAL
  Filled 2014-12-17: qty 1

## 2014-12-17 MED ORDER — HYDROCODONE-HOMATROPINE 5-1.5 MG/5ML PO SYRP: 5.0000 mL | ORAL_SOLUTION | ORAL | Status: DC | PRN

## 2014-12-17 MED ORDER — POTASSIUM CHLORIDE CRYS ER 10 MEQ PO TBCR: 10.0000 meq | EXTENDED_RELEASE_TABLET | Freq: Every day | ORAL | Status: DC

## 2014-12-17 MED ORDER — POTASSIUM CHLORIDE CRYS ER 20 MEQ PO TBCR
40.0000 meq | EXTENDED_RELEASE_TABLET | Freq: Every day | ORAL | Status: DC
  Administered 2014-12-17: 40 meq via ORAL
  Filled 2014-12-17: qty 2

## 2014-12-17 MED ORDER — LISINOPRIL 2.5 MG PO TABS: 2.5000 mg | ORAL_TABLET | Freq: Every day | ORAL | Status: DC

## 2014-12-17 MED ORDER — FUROSEMIDE 20 MG PO TABS: 20.0000 mg | ORAL_TABLET | Freq: Every day | ORAL | Status: DC

## 2014-12-17 NOTE — Discharge Summary (Signed)
Physician Discharge Summary  Patient ID: [name] MRN: [id number] DOB/AGE: [birth year] 80 y.o.  Admit date: 12/15/2014 Discharge date: 12/17/2014  Primary Care Physician:  Kathlene November, MD  Discharge Diagnoses:    . dyspnea with bilateral Pleural effusion . generalize fatigue with palpitations  . Persistent atrial fibrillation . Elevated LFTs . Calculus of gallbladder without cholecystitis without obstruction . HTN (hypertension) . Hyperlipidemia   Consults: Cardiology, Dr. Domenic Polite   Recommendations for Outpatient Follow-up:    TESTS THAT NEED FOLLOW-UP BMET, LFTs at the follow-up appointment   DIET: Heart healthy diet    Allergies:  No Known Allergies   Discharge Medications:   Medication List    STOP taking these medications        atorvastatin 20 MG tablet  Commonly known as:  LIPITOR      TAKE these medications        aspirin EC 81 MG tablet  Take 81 mg by mouth daily.     benzonatate 100 MG capsule  Commonly known as:  TESSALON  Take 1 capsule (100 mg total) by mouth 3 (three) times daily. For cough     furosemide 20 MG tablet  Commonly known as:  LASIX  Take 1 tablet (20 mg total) by mouth daily.     HYDROcodone-homatropine 5-1.5 MG/5ML syrup  Commonly known as:  HYCODAN  Take 5 mLs by mouth every 4 (four) hours as needed for cough.     levETIRAcetam 750 MG tablet  Commonly known as:  KEPPRA  Take 750 mg by mouth 2 (two) times daily.     lisinopril 2.5 MG tablet  Commonly known as:  PRINIVIL,ZESTRIL  Take 1 tablet (2.5 mg total) by mouth daily.     metoprolol tartrate 25 MG tablet  Commonly known as:  LOPRESSOR  Take 1 tablet (25 mg total) by mouth 2 (two) times daily.     potassium chloride 10 MEQ tablet  Commonly known as:  K-DUR,KLOR-CON  Take 1 tablet (10 mEq total) by mouth daily.     rivaroxaban 20 MG Tabs tablet  Commonly known as:  XARELTO  Take 1 tablet (20 mg total) by mouth daily with supper.         Brief H and  P: For complete details please refer to admission H and P, but in brief patient is a 80 year old female with diagnosis of atrial fibrillation 3 days prior to admission, was placed on Lopressor and Xarelto by her PCP, presented to ED with complaints of worsening shortness of breath and exertion over the past 2 days prior to admission. She was evaluated at [facility] ED and was found to have bilateral pleural effusions, elevated transaminases. Patient was referred to [facility] for admission and further workup.  Hospital Course:   SOB (shortness of breath) likely due to bilateral pleural effusions, from CHF from new onset atrial fibrillation Currently improving, patient was placed on IV Lasix for diuresis, had an net negative balance of 1.8 L since admission. Troponins remained negative, was ruled out for acute ACS. 2-D echo showed EF of 50-55%. Cardiology was consulted and recommended oral Lasix 20 mg daily with this low-dose potassium replacement. She will follow-up with Dr. Johnsie Cancel, her primary cardiologist.   Hyperlipidemia - On atorvastatin, hold due to transaminitis   HTN (hypertension) - Continue Lopressor    Seizure - Continue Keppra    Persistent atrial fibrillation - Rate controlled, continue xarelto    Elevated LFTs - LFTs has been trending down.  Abdominal ultrasound showed 2 small gallstones without any cholecystitis, slight nodularity of the liver due to cirrhosis. No abdominal pain or tenderness. - Hold statins  Day of Discharge BP 142/60 mmHg  Pulse 76  Temp(Src) 97.9 F (36.6 C) (Oral)  Resp 24  Ht 5\' 7"  (1.702 m)  Wt 82.101 kg (181 lb)  BMI 28.34 kg/m2  SpO2 95%  Physical Exam: General: Alert and awake oriented x3 not in any acute distress. CVS: Irregularly irregular with 2/6 systolic murmur  Chest: clear to auscultation bilaterally, no wheezing rales or rhonchi Abdomen: soft nontender, nondistended, normal bowel sounds Extremities: no cyanosis, clubbing,  trace edema noted bilaterally Neuro: Cranial nerves II-XII intact, no focal neurological deficits   The results of significant diagnostics from this hospitalization (including imaging, microbiology, ancillary and laboratory) are listed below for reference.    LAB RESULTS: Basic Metabolic Panel:  Recent Labs Lab 12/12/14 1553  12/16/14 0250 12/17/14 0319  NA 139  < > 138 140  K 4.7  < > 3.4* 3.9  CL 105  < > 102 106  CO2 22  < > 22 26  GLUCOSE 100*  < > 99 105*  BUN 13  < > 18 21  CREATININE 0.9  < > 1.09 1.02  CALCIUM 9.2  < > 8.4 8.5  MG 2.2  --   --   --   < > = values in this interval not displayed. Liver Function Tests:  Recent Labs Lab 12/15/14 1605 12/17/14 0319  AST 241* 115*  ALT 268* 163*  ALKPHOS 206* 147*  BILITOT 1.2 1.2  PROT 7.4 5.7*  ALBUMIN 4.4 3.4*   No results for input(s): LIPASE, AMYLASE in the last 168 hours. No results for input(s): AMMONIA in the last 168 hours. CBC:  Recent Labs Lab 12/15/14 1605 12/16/14 0250  WBC 11.5* 8.6  NEUTROABS 7.9*  --   HGB 16.0* 13.8  HCT 47.8* 41.7  MCV 89.3 88.5  PLT 230 204   Cardiac Enzymes:  Recent Labs Lab 12/16/14 0250 12/16/14 0958  TROPONINI <0.03 0.03   BNP: Invalid input(s): POCBNP CBG:  Recent Labs Lab 12/15/14 2318  GLUCAP 107*    Significant Diagnostic Studies:  Dg Chest 2 View  12/15/2014   CLINICAL DATA:  80 year old female with increasing shortness of breath for several days. Cough. Initial encounter.  EXAM: CHEST  2 VIEW  COMPARISON:  12/14/2014 and earlier.  FINDINGS: Continued opacity at both lung bases suggesting pleural effusions. No interval progression. Stable lung volumes. No pneumothorax, pulmonary edema, or new pulmonary opacity. Stable cardiac size and mediastinal contours. Postoperative changes to the bilateral axilla and chest wall. No acute osseous abnormality identified.  IMPRESSION: Stable bilateral pleural effusions. No new cardiopulmonary abnormality.    Electronically Signed   By: Lars Pinks M.D.   On: 12/15/2014 16:20   US Abdomen Complete  12/15/2014   CLINICAL DATA:  80 year old female with shortness breath, fatigue, cough for the past 2-4 weeks, and elevated liver function tests. Elevated white blood cell count.  EXAM: ULTRASOUND ABDOMEN COMPLETE  COMPARISON:  No priors.  FINDINGS: Gallbladder: 2 small echogenic foci with posterior acoustic shadowing in the neck of the gallbladder, compatible with small gallstones. Gallbladder appears only mildly distended. Gallbladder wall appears slightly edematous, measuring up to 5 mm in thickness. No pericholecystic fluid. Per report from the sonographer, the patient did not exhibit a sonographic Murphy's sign on examination.  Common bile duct: Diameter: 2.9 mm in the porta hepatis.  Liver: Slight nodular contour of the liver. No focal lesion identified. Within normal limits in parenchymal echogenicity. Bidirectional flow noted in the portal vein.  IVC: No abnormality visualized.  Pancreas: Visualized portion unremarkable.  Spleen: Size and appearance within normal limits.  5.4 cm in length.  Right Kidney: Length: 11.3 cm. Echogenicity within normal limits. 1.0 x 1.0 x 0.9 cm hypoechoic lesion with increased through transmission in the upper pole, likely a tiny cyst. No hydronephrosis visualized.  Left Kidney: Length: 11.1 cm. Echogenicity within normal limits. No mass or hydronephrosis visualized.  Abdominal aorta: No aneurysm visualized.  Other findings: Bilateral pleural effusions.  IMPRESSION: 1. Slightly nodular contour of the liver with bidirectional flow on the portal vein. These findings may indicate underlying cirrhosis. 2. There are 2 small gallstones in the neck of the gallbladder. No definitive findings to suggest acute cholecystitis at this time. Mild gallbladder wall thickening may in part relate to under distention of the gallbladder, and could also be related to intrinsic hepatic disease. 3. Bilateral  pleural effusions. 4. Small hypoechoic lesion in the upper pole of the right kidney is likely a small cyst.   Electronically Signed   By: Vinnie Langton M.D.   On: 12/15/2014 18:04    2D ECHO:  Study Conclusions  - Procedure narrative: Transthoracic echocardiography. Image quality was fair, with poor endocardial visualization. The study was technically difficult. - Left ventricle: The cavity size was normal. Wall thickness was increased in a pattern of mild LVH. Systolic function was normal. The estimated ejection fraction was in the range of 50% to 55%. Images were inadequate for LV wall motion assessment. The study was not technically sufficient to allow evaluation of LV diastolic dysfunction due to atrial fibrillation. Doppler parameters are consistent with high ventricular filling pressure. - Aortic valve: Moderately calcified annulus. Moderately thickened, moderately calcified leaflets. Morphologically, there appears to be reduced cusp mobility with moderate to severe aortic stenosis. Peak velocities and mean gradients are likely underestimated. Mild eccentric regurgitation directed towards the anterior mitral leaflet. By planimetry, valve area is 0.83 cm squared. Peak velocity measured at 3.14 m/s. This is likely underestimated. Valve area (VTI): 1.24 cm^2. Valve area (Vmax): 1.19 cm^2. Valve area (Vmean): 1.19 cm^2. - Mitral valve: There was mild regurgitation. - Left atrium: The atrium was mildly to moderately dilated. - Right ventricle: The cavity size was normal. Wall thickness was normal. Systolic function was mildly reduced. - Right atrium: The atrium was moderately dilated. - Tricuspid valve: There was moderate regurgitation. - Pulmonary arteries: PA peak pressure: 47 mm Hg (S). Moderately elevated pulmonary pressures. - Systemic veins: IVC is dilated with normal respiratory variation. Estimated CVP 8 mmHg.  Disposition and  Follow-up:     Discharge Instructions    (HEART FAILURE PATIENTS) Call MD:  Anytime you have any of the following symptoms: 1) 3 pound weight gain in 24 hours or 5 pounds in 1 week 2) shortness of breath, with or without a dry hacking cough 3) swelling in the hands, feet or stomach 4) if you have to sleep on extra pillows at night in order to breathe.    Complete by:  As directed      Diet - low sodium heart healthy    Complete by:  As directed      Increase activity slowly    Complete by:  As directed             DISPOSITION: home   DISCHARGE FOLLOW-UP Follow-up Information  Follow up with Kathlene November, MD. Schedule an appointment as soon as possible for a visit in 10 days.   Specialty:  Internal Medicine   Why:  for hospital follow-up, obtain labs BMET, LFT   Contact information:   [address] [place] [address] [phone]       Follow up with Jenkins Rouge, MD. Schedule an appointment as soon as possible for a visit in 2 weeks.   Specialty:  Cardiology   Why:  for hospital follow-up, office will call to set up appointment   Contact information:   [address] [place] [address] [phone]        Time spent on Discharge: 35 mins  Signed:   RAI,RIPUDEEP M.D. [facility] 12/17/2014, 11:31 AM Pager: [phone]

## 2014-12-17 NOTE — Progress Notes (Signed)
Primary cardiologist: Dr. Jenkins Rouge  Seen for followup: Atrial fibrillation  Subjective:    Patient states that she feels better today. Less short of breath. No chest pain.  Objective:   Temp:  [97.7 F (36.5 C)-97.9 F (36.6 C)] 97.9 F (36.6 C) (01/10 0509) Pulse Rate:  [53-64] 53 (01/10 0509) Resp:  [20-28] 24 (01/10 0509) BP: (97-142)/(57-79) 142/67 mmHg (01/10 0509) SpO2:  [93 %-95 %] 94 % (01/10 0509) Weight:  [181 lb (82.101 kg)] 181 lb (82.101 kg) (01/10 0509) Last BM Date: 12/15/14  Filed Weights   12/15/14 2243 12/16/14 0450 12/17/14 0509  Weight: 176 lb 6.4 oz (80.015 kg) 182 lb 5.1 oz (82.7 kg) 181 lb (82.101 kg)    Intake/Output Summary (Last 24 hours) at 12/17/14 1037 Last data filed at 12/17/14 0514  Gross per 24 hour  Intake    240 ml  Output    900 ml  Net   -660 ml    Telemetry: Rate-controlled atrial fibrillation.  Exam:  General: Appears comfortable at rest.  Lungs: Clear, decreased breath sounds at bases. Nonlabored.  Cardiac: Irregularly irregular with 2/6 systolic murmur at the base.  Abdomen: NABS.  Extremities: Trace ankle edema.  Lab Results:  Basic Metabolic Panel:  Recent Labs Lab 12/12/14 1553  12/15/14 1605 12/16/14 0250 12/17/14 0319  NA 139  < > 136 138 140  K 4.7  < > 3.5 3.4* 3.9  CL 105  < > 103 102 106  CO2 22  < > 23 22 26   GLUCOSE 100*  < > 122* 99 105*  BUN 13  < > 23 18 21   CREATININE 0.9  < > 1.21* 1.09 1.02  CALCIUM 9.2  < > 8.8 8.4 8.5  MG 2.2  --   --   --   --   < > = values in this interval not displayed.  Liver Function Tests:  Recent Labs Lab 12/14/14 1125 12/15/14 1605 12/17/14 0319  AST 223* 241* 115*  ALT 205* 268* 163*  ALKPHOS 163* 206* 147*  BILITOT 1.2 1.2 1.2  PROT 6.9 7.4 5.7*  ALBUMIN 4.2 4.4 3.4*    CBC:  Recent Labs Lab 12/14/14 1040 12/15/14 1605 12/16/14 0250  WBC 12.3* 11.5* 8.6  HGB 16.8* 16.0* 13.8  HCT 50.7* 47.8* 41.7  MCV 89.6 89.3 88.5  PLT 261  230 204    Cardiac Enzymes:  Recent Labs Lab 12/15/14 2245 12/16/14 0250 12/16/14 0958  TROPONINI <0.03 <0.03 0.03    Echocardiogram 12/16/14: Study Conclusions  - Procedure narrative: Transthoracic echocardiography. Image quality was fair, with poor endocardial visualization. The study was technically difficult. - Left ventricle: The cavity size was normal. Wall thickness was increased in a pattern of mild LVH. Systolic function was normal. The estimated ejection fraction was in the range of 50% to 55%. Images were inadequate for LV wall motion assessment. The study was not technically sufficient to allow evaluation of LV diastolic dysfunction due to atrial fibrillation. Doppler parameters are consistent with high ventricular filling pressure. - Aortic valve: Moderately calcified annulus. Moderately thickened, moderately calcified leaflets. Morphologically, there appears to be reduced cusp mobility with moderate to severe aortic stenosis. Peak velocities and mean gradients are likely underestimated. Mild eccentric regurgitation directed towards the anterior mitral leaflet. By planimetry, valve area is 0.83 cm squared. Peak velocity measured at 3.14 m/s. This is likely underestimated. Valve area (VTI): 1.24 cm^2. Valve area (Vmax): 1.19 cm^2. Valve area (Vmean): 1.19 cm^2. -  Mitral valve: There was mild regurgitation. - Left atrium: The atrium was mildly to moderately dilated. - Right ventricle: The cavity size was normal. Wall thickness was normal. Systolic function was mildly reduced. - Right atrium: The atrium was moderately dilated. - Tricuspid valve: There was moderate regurgitation. - Pulmonary arteries: PA peak pressure: 47 mm Hg (S). Moderately elevated pulmonary pressures. - Systemic veins: IVC is dilated with normal respiratory variation. Estimated CVP 8 mmHg.   Medications:   Scheduled Medications: . aspirin EC  81 mg  Oral Daily  . benzonatate  100 mg Oral TID  . furosemide  20 mg Intravenous Q12H  . levETIRAcetam  750 mg Oral BID  . lisinopril  2.5 mg Oral Daily  . metoprolol tartrate  25 mg Oral BID  . potassium chloride  40 mEq Oral Daily  . rivaroxaban  20 mg Oral Q supper  . sodium chloride  3 mL Intravenous Q12H  . sodium chloride  3 mL Intravenous Q12H  . sodium chloride  3 mL Intravenous Q12H      PRN Medications:  sodium chloride, sodium chloride, acetaminophen **OR** acetaminophen, alum & mag hydroxide-simeth, HYDROcodone-homatropine, HYDROmorphone (DILAUDID) injection, menthol-cetylpyridinium, ondansetron **OR** ondansetron (ZOFRAN) IV, oxyCODONE, sodium chloride, sodium chloride   Assessment:   1. Newly diagnosed, but likely persistent atrial fibrillation over the last several weeks based on symptom description. CHADSVASC score is 5. Heart rate is well controlled on current dose of Lopressor, and she is on Xarelto for stroke prophylaxis. LVEF 50-55% range by follow-up echocardiogram, no clear evidence of tachycardia-mediated cardiomyopathy.  2. Moderate to severe calcific aortic stenosis by follow-up echocardiogram, progressed compared to 2014. Could also be contributing to rhythm and diastolic heart failure symptoms.  3. Peripheral arterial disease, right common iliac artery stenosis documented 2007.  4. Essential hypertension.  5. Mixed hyperlipidemia.  6. Prior history of uterine and breast cancer.   Plan/Discussion:    Spoke with patient and family today. She feels better and would like to go home. Plans to ambulate in the hall with nursing first. Would continue Xarelto, Lopressor, and lisinopril. Would also send her home on Lasix 20 mg daily for now with low-dose potassium supplement. We will schedule a follow-up office visit within the next 1-2 weeks with Dr. Johnsie Cancel for ongoing management.  Satira Sark, M.D., F.A.C.C.

## 2014-12-17 NOTE — Progress Notes (Addendum)
I have left a message on our office's scheduling voicemail requesting a follow-up appointment, and our office will call the patient with this information. Dr. Domenic Polite requested f/u 1-2 weeks with Dr. Johnsie Cancel rather than [facility]. This is his primary cardiologist. Melina Copa PA-C

## 2014-12-17 NOTE — Progress Notes (Signed)
Patient discharged about 1300 to home with family and all discharge instructions/prescriptions. Patient vitals stable. Patient denies any pain. All questions answered before discharge.

## 2014-12-18 NOTE — Telephone Encounter (Signed)
Follow up   Pt was dx'd from hosp this weekend and per dx notes- Follow up with Jenkins Rouge, MD. Schedule an appointment as soon as possible for a visit in 2 weeks.Pt is aware that next avail [facility] appt is Feb 3, and was offered an appt with PA but husband wanted pt to see cardiologist. Pt husband requesting to speak with Rn.  Please call back and discuss.

## 2014-12-18 NOTE — Telephone Encounter (Signed)
Husband calling requesting an appointment this week with Dr. Johnsie Cancel.  Wife was present and gave permission for me to speak with him.  States she was in the hospital over the weekend and was told by Dr. Domenic Polite for her to be seen by Dr. Johnsie Cancel this week due to echo results being worse. Echo was done 12/16/14 in hospital.  Advised Dr. Johnsie Cancel did not have any available appointments but could have her see the NP tomorrow while Dr. Johnsie Cancel is in the office. He insisted that she see Dr. Johnsie Cancel but again advised that he did not have any available appointments until Feb.  After further discussion he agreed to appointment tomorrow with Kathrene Alu at 9:30.

## 2014-12-19 ENCOUNTER — Encounter: Payer: Self-pay | Admitting: Nurse Practitioner

## 2014-12-19 ENCOUNTER — Telehealth: Payer: Self-pay

## 2014-12-19 ENCOUNTER — Encounter: Payer: Self-pay | Admitting: Internal Medicine

## 2014-12-19 ENCOUNTER — Ambulatory Visit (INDEPENDENT_AMBULATORY_CARE_PROVIDER_SITE_OTHER): Payer: Medicare Other | Admitting: Nurse Practitioner

## 2014-12-19 VITALS — HR 62 | Ht 67.0 in | Wt 182.6 lb

## 2014-12-19 DIAGNOSIS — E785 Hyperlipidemia, unspecified: Secondary | ICD-10-CM

## 2014-12-19 DIAGNOSIS — I10 Essential (primary) hypertension: Secondary | ICD-10-CM

## 2014-12-19 DIAGNOSIS — R945 Abnormal results of liver function studies: Secondary | ICD-10-CM

## 2014-12-19 DIAGNOSIS — I4891 Unspecified atrial fibrillation: Secondary | ICD-10-CM

## 2014-12-19 DIAGNOSIS — I350 Nonrheumatic aortic (valve) stenosis: Secondary | ICD-10-CM

## 2014-12-19 DIAGNOSIS — I4819 Other persistent atrial fibrillation: Secondary | ICD-10-CM

## 2014-12-19 DIAGNOSIS — Z5181 Encounter for therapeutic drug level monitoring: Secondary | ICD-10-CM

## 2014-12-19 DIAGNOSIS — R7989 Other specified abnormal findings of blood chemistry: Secondary | ICD-10-CM

## 2014-12-19 LAB — CBC
HCT: 48.3 % — ABNORMAL HIGH (ref 36.0–46.0)
Hemoglobin: 15.8 g/dL — ABNORMAL HIGH (ref 12.0–15.0)
MCHC: 32.8 g/dL (ref 30.0–36.0)
MCV: 89.6 fl (ref 78.0–100.0)
Platelets: 249 10*3/uL (ref 150.0–400.0)
RBC: 5.39 Mil/uL — ABNORMAL HIGH (ref 3.87–5.11)
RDW: 14.1 % (ref 11.5–15.5)
WBC: 12.2 10*3/uL — ABNORMAL HIGH (ref 4.0–10.5)

## 2014-12-19 LAB — HEPATIC FUNCTION PANEL
ALT: 157 U/L — ABNORMAL HIGH (ref 0–35)
AST: 96 U/L — ABNORMAL HIGH (ref 0–37)
Albumin: 4.3 g/dL (ref 3.5–5.2)
Alkaline Phosphatase: 213 U/L — ABNORMAL HIGH (ref 39–117)
Bilirubin, Direct: 0.4 mg/dL — ABNORMAL HIGH (ref 0.0–0.3)
Total Bilirubin: 1.5 mg/dL — ABNORMAL HIGH (ref 0.2–1.2)
Total Protein: 7.6 g/dL (ref 6.0–8.3)

## 2014-12-19 LAB — BASIC METABOLIC PANEL
BUN: 22 mg/dL (ref 6–23)
CO2: 25 mEq/L (ref 19–32)
Calcium: 9.2 mg/dL (ref 8.4–10.5)
Chloride: 101 mEq/L (ref 96–112)
Creatinine, Ser: 1 mg/dL (ref 0.4–1.2)
GFR: 57.07 mL/min — ABNORMAL LOW (ref >60.00)
Glucose, Bld: 122 mg/dL — ABNORMAL HIGH (ref 70–99)
Potassium: 3.4 mEq/L — ABNORMAL LOW (ref 3.5–5.1)
Sodium: 134 mEq/L — ABNORMAL LOW (ref 135–145)

## 2014-12-19 LAB — PROTIME-INR
INR: 2.6 ratio — ABNORMAL HIGH (ref 0.8–1.0)
Prothrombin Time: 28.4 s — ABNORMAL HIGH (ref 9.6–13.1)

## 2014-12-19 LAB — APTT: aPTT: 33.8 s — ABNORMAL HIGH (ref 23.4–32.7)

## 2014-12-19 MED ORDER — WARFARIN SODIUM 5 MG PO TABS: 5.0000 mg | ORAL_TABLET | Freq: Every day | ORAL | Status: DC

## 2014-12-19 NOTE — Telephone Encounter (Signed)
thx

## 2014-12-19 NOTE — Patient Instructions (Addendum)
We will be checking the following labs today BMET, CBC, PT, PTT, HPF  Finish your current supply of Xarelto - should just be 2 more pills and then STOP  Start Coumadin (Warfarin) 5 mg tablet - start out with just one tablet each evening  Activity as tolerated  See Dr. Johnsie Cancel in 2 to 3 weeks  Will refer you to [facility]  See Dr. Larose Kells as planned  No need to see Dr. Rayann Heman later this week  Call the [place] office at [phone] if you have any questions, problems or concerns.

## 2014-12-19 NOTE — Progress Notes (Signed)
[name] Date of Birth: [birth year] Medical Record #[id number]  History of Present Illness: Ms. Delio is seen back today for a work in post hospital visit. Seen for Dr. Johnsie Cancel. She is a 80 year old female with new onset AF - on lopressor and Xarelto by PCP. Other issues include HLD, HTN, PVD with right common iliac artery stenosis, prior uterine and breast cancer, and seizure disorder. Her CHADSVASC is 5.   She has not been seen here by Dr. Johnsie Cancel since September of 2014.   Apparently not feeling well for past several months - lots of fatigue and a cough. Ended up going to ER - refused admission. Then became more dyspnea. Presented to the hospital earlier this month due to progressive dyspnea - found to have bilateral effusions and CHF felt to be due to her AF. She was diuresed. Echo with EF of 50 to 55% - moderate to severe AS noted. No clear cut evidence of tachycardia mediated CM. Noted during this admission to have elevated LFTs - cirrhosis mentioned on ultrasound - now off Lipitor. Remains in AF. Discharge plan unclear as to timing/sequence of any restoration of NSR, possible cath to further assess her valve, what GI work up is needed, etc. She is on Xarelto for her anticoagulation since January 5th.  She comes in today. She is here with her husband. She wishes that he does all the talking for her. She remains fatigued - has been for several months. No more shortness of breath. No chest pain. She has not had syncope. Very little in the way of palpitations. She continues to have daily alcohol use - 1 to 2 glasses of wine per day. No GI follow up noted. Off Lipitor. No bleeding or bruising noted.   Current Outpatient Prescriptions  Medication Sig Dispense Refill  . aspirin EC 81 MG tablet Take 81 mg by mouth daily.    . benzonatate (TESSALON) 100 MG capsule Take 1 capsule (100 mg total) by mouth 3 (three) times daily. For cough 30 capsule 0  . furosemide (LASIX) 20 MG tablet Take 1 tablet  (20 mg total) by mouth daily. 30 tablet 3  . levETIRAcetam (KEPPRA) 750 MG tablet Take 750 mg by mouth 2 (two) times daily.    Marland Kitchen lisinopril (PRINIVIL,ZESTRIL) 2.5 MG tablet Take 1 tablet (2.5 mg total) by mouth daily. 30 tablet 3  . metoprolol tartrate (LOPRESSOR) 25 MG tablet Take 1 tablet (25 mg total) by mouth 2 (two) times daily. 60 tablet 1  . potassium chloride (K-DUR,KLOR-CON) 10 MEQ tablet Take 1 tablet (10 mEq total) by mouth daily. 30 tablet 3  . rivaroxaban (XARELTO) 20 MG TABS tablet Take 1 tablet (20 mg total) by mouth daily with supper. 30 tablet 3   No current facility-administered medications for this visit.    No Known Allergies  Past Medical History  Diagnosis Date  . Epilepsy, focal August 2012    Initially diagnosed as TIA   . Essential hypertension   . Hyperlipidemia   . PVD (peripheral vascular disease)     Saw Dr Albertine Patricia 2007 - right common Iliac artery stenosis  . Uterine cancer   . Breast CA     Bilateral  . Atrial fibrillation     Recently diagnosed  . Aortic stenosis     Mild 2014     Past Surgical History  Procedure Laterality Date  . Mastectomy Bilateral 2007  . Total abdominal hysterectomy  1990s    History  Smoking status  . Never Smoker   Smokeless tobacco  . Never Used    History  Alcohol Use  . Yes    Comment: 2 glasses of wine daily    Family History  Problem Relation Age of Onset  . Hypertension      Review of Systems: The review of systems is per the HPI.  All other systems were reviewed and are negative.  Physical Exam: Pulse 62  Ht 5\' 7"  (1.702 m)  Wt 182 lb 9.6 oz (82.827 kg)  BMI 28.59 kg/m2  SpO2 98%  BP 130/60 by me. Patient is alert and in no acute distress. Affect is pretty flat. Skin is warm and dry. Color is normal. Maybe slightly pale. HEENT is unremarkable. Normocephalic/atraumatic. PERRL. Sclera are nonicteric. Neck is supple. No masses. No JVD. Lungs are fairly clear. Cardiac exam shows an irregular  rhythm. Rate ok. Harsh outflow murmur. Abdomen is soft. Extremities are without edema. Gait and ROM are intact. No gross neurologic deficits noted.  Wt Readings from Last 3 Encounters:  12/19/14 182 lb 9.6 oz (82.827 kg)  12/17/14 181 lb (82.101 kg)  12/14/14 185 lb (83.915 kg)    LABORATORY DATA/PROCEDURES: PENDING  Lab Results  Component Value Date   WBC 8.6 12/16/2014   HGB 13.8 12/16/2014   HCT 41.7 12/16/2014   PLT 204 12/16/2014   GLUCOSE 105* 12/17/2014   CHOL 277* 10/05/2014   TRIG 190.0* 10/05/2014   HDL 73.90 10/05/2014   LDLDIRECT 174.5 07/28/2013   LDLCALC 165* 10/05/2014   ALT 163* 12/17/2014   AST 115* 12/17/2014   NA 140 12/17/2014   K 3.9 12/17/2014   CL 106 12/17/2014   CREATININE 1.02 12/17/2014   BUN 21 12/17/2014   CO2 26 12/17/2014   TSH 1.72 12/12/2014    BNP (last 3 results) No results for input(s): PROBNP in the last 8760 hours.  Echo Study Conclusions from 12/2014  - Procedure narrative: Transthoracic echocardiography. Image quality was fair, with poor endocardial visualization. The study was technically difficult. - Left ventricle: The cavity size was normal. Wall thickness was increased in a pattern of mild LVH. Systolic function was normal. The estimated ejection fraction was in the range of 50% to 55%. Images were inadequate for LV wall motion assessment. The study was not technically sufficient to allow evaluation of LV diastolic dysfunction due to atrial fibrillation. Doppler parameters are consistent with high ventricular filling pressure. - Aortic valve: Moderately calcified annulus. Moderately thickened, moderately calcified leaflets. Morphologically, there appears to be reduced cusp mobility with moderate to severe aortic stenosis. Peak velocities and mean gradients are likely underestimated. Mild eccentric regurgitation directed towards the anterior mitral leaflet. By planimetry, valve area is 0.83 cm  squared. Peak velocity measured at 3.14 m/s. This is likely underestimated. Valve area (VTI): 1.24 cm^2. Valve area (Vmax): 1.19 cm^2. Valve area (Vmean): 1.19 cm^2. - Mitral valve: There was mild regurgitation. - Left atrium: The atrium was mildly to moderately dilated. - Right ventricle: The cavity size was normal. Wall thickness was normal. Systolic function was mildly reduced. - Right atrium: The atrium was moderately dilated. - Tricuspid valve: There was moderate regurgitation. - Pulmonary arteries: PA peak pressure: 47 mm Hg (S). Moderately elevated pulmonary pressures. - Systemic veins: IVC is dilated with normal respiratory variation. Estimated CVP 8 mmHg.    Dg Chest 2 View  12/15/2014   CLINICAL DATA:  80 year old female with increasing shortness of breath for several days. Cough. Initial encounter.  EXAM: CHEST  2 VIEW  COMPARISON:  12/14/2014 and earlier.  FINDINGS: Continued opacity at both lung bases suggesting pleural effusions. No interval progression. Stable lung volumes. No pneumothorax, pulmonary edema, or new pulmonary opacity. Stable cardiac size and mediastinal contours. Postoperative changes to the bilateral axilla and chest wall. No acute osseous abnormality identified.  IMPRESSION: Stable bilateral pleural effusions. No new cardiopulmonary abnormality.   Electronically Signed   By: Lars Pinks M.D.   On: 12/15/2014 16:20   Dg Chest 2 View  12/14/2014   CLINICAL DATA:  Cough for 2 weeks.  EXAM: CHEST  2 VIEW  COMPARISON:  PA and lateral chest 12/12/2014. Single view of the chest 04/08/2006.  FINDINGS: The patient is status post bilateral mastectomy and axillary dissection. There are small bilateral pleural effusions and basilar atelectasis. Small effusions appear slightly increased since the prior examination. Heart size is upper normal but there is no pulmonary edema. No pneumothorax is identified.  IMPRESSION: Small bilateral pleural effusions and basilar  atelectasis so slight increase since the study 2 days ago.  Cardiomegaly without edema.   Electronically Signed   By: Inge Rise M.D.   On: 12/14/2014 11:12   Dg Chest 2 View  12/12/2014   CLINICAL DATA:  Persistent cough, new onset atrial fibrillation, history hypertension, breast cancer post BILATERAL mastectomy, uterine cancer  EXAM: CHEST  2 VIEW  COMPARISON:  04/08/2006  FINDINGS: Mild enlargement of cardiac silhouette.  Atherosclerotic calcification aorta.  Mediastinal contours and pulmonary vascularity normal.  Prior BILATERAL breast reconstructions.  Bibasilar atelectasis versus scarring.  No acute infiltrate, definite pleural effusion or pneumothorax.  Bones demineralized.  IMPRESSION: Enlargement of cardiac silhouette.  Bibasilar atelectasis versus scarring.   Electronically Signed   By: Lavonia Dana M.D.   On: 12/12/2014 18:20   US Abdomen Complete  12/15/2014   CLINICAL DATA:  80 year old female with shortness breath, fatigue, cough for the past 2-4 weeks, and elevated liver function tests. Elevated white blood cell count.  EXAM: ULTRASOUND ABDOMEN COMPLETE  COMPARISON:  No priors.  FINDINGS: Gallbladder: 2 small echogenic foci with posterior acoustic shadowing in the neck of the gallbladder, compatible with small gallstones. Gallbladder appears only mildly distended. Gallbladder wall appears slightly edematous, measuring up to 5 mm in thickness. No pericholecystic fluid. Per report from the sonographer, the patient did not exhibit a sonographic Murphy's sign on examination.  Common bile duct: Diameter: 2.9 mm in the porta hepatis.  Liver: Slight nodular contour of the liver. No focal lesion identified. Within normal limits in parenchymal echogenicity. Bidirectional flow noted in the portal vein.  IVC: No abnormality visualized.  Pancreas: Visualized portion unremarkable.  Spleen: Size and appearance within normal limits.  5.4 cm in length.  Right Kidney: Length: 11.3 cm. Echogenicity within  normal limits. 1.0 x 1.0 x 0.9 cm hypoechoic lesion with increased through transmission in the upper pole, likely a tiny cyst. No hydronephrosis visualized.  Left Kidney: Length: 11.1 cm. Echogenicity within normal limits. No mass or hydronephrosis visualized.  Abdominal aorta: No aneurysm visualized.  Other findings: Bilateral pleural effusions.  IMPRESSION: 1. Slightly nodular contour of the liver with bidirectional flow on the portal vein. These findings may indicate underlying cirrhosis. 2. There are 2 small gallstones in the neck of the gallbladder. No definitive findings to suggest acute cholecystitis at this time. Mild gallbladder wall thickening may in part relate to under distention of the gallbladder, and could also be related to intrinsic hepatic disease. 3. Bilateral pleural effusions.  4. Small hypoechoic lesion in the upper pole of the right kidney is likely a small cyst.   Electronically Signed   By: Vinnie Langton M.D.   On: 12/15/2014 18:04    Assessment / Plan: 1. New onset AF - her rate is controlled by exam. She is on anticoagulation - this is with Xarelto and concerning given her LFTs. Discussed with Dr. Angelena Form - will need to transition to coumadin - seen today by pharmacy. Would plan for cardioversion after 4 weeks of therapeutic INRs'. She is not symptomatic at this time with her atrial fib but will need attempt at restoration to NSR.   2. Progressive AS - reviewed by Dr. Angelena Form. Mean gradient 19. Felt to be moderate - will continue to follow for now.  3. Abnormal LFTs - has daily alcohol use - needs to stop. Already off of her statin. Needs to get to GI - will refer on. She has seen [facility] in the past.   Discussed at length with Dr. Angelena Form. He has advised - Changing to coumadin. Labs today. GI referral. Plan cardioversion after 4 weeks of therapeutic INRs. See Dr. Johnsie Cancel back in 2 to 3 weeks. No need to see Dr. Rayann Heman later this week at this time.  Patient is agreeable  to this plan and will call if any problems develop in the interim.   Burtis Junes, RN, [facility] [address] [place], [place]  [phone]

## 2014-12-19 NOTE — Telephone Encounter (Signed)
Admit date: 12/15/2014 Discharge date: 12/17/2014  Reason for admission:  Bilateral Pleural Effusion; A. Fib  Information below provided by patient and husband.   Transition Care Management Follow-up Telephone Call  How have you been since you were released from the hospital? Pt states that she is better.  She is still coughing (cough is only slightly productive) and feels fatigue; otherwise she denies chest pain, palpitations, no shortness of breath, pain or abnormal swelling.    Do you understand why you were in the hospital? yes   Do you understand the discharge instructions? yes  Items Reviewed:  Medications reviewed: yes; pt has been switched from Xarelto to Coumadin.  Scheduled to be seen by [facility] at [facility] on 12/25/14 @ 2:30pm.    Allergies reviewed: yes  Dietary changes reviewed: yes, hearth healthy  Referrals reviewed: yes; pt saw Cardiology today.  Labs were drawn.  Results not in chart yet.  She has another appointment with Dr. Johnsie Cancel on 01/11/15 @ 3:15 pm.  She also has an appointment with GI on 01/01/15 @ 10 am for Abnormal LFTs.     Functional Questionnaire:   Activities of Daily Living (ADLs):   She states they are independent in the following: ambulation, bathing and hygiene, feeding, continence, grooming, toileting and dressing States they require assistance with the following: none   Any transportation issues/concerns?: no, husband provides transportation   Any patient concerns? no   Confirmed importance and date/time of follow-up visits scheduled: yes   Confirmed with patient if condition begins to worsen call PCP or go to the ER: yes   Hospital follow up appointment scheduled for 12/20/14 @ 11:30 am with Dr. Larose Kells.

## 2014-12-20 ENCOUNTER — Ambulatory Visit (HOSPITAL_BASED_OUTPATIENT_CLINIC_OR_DEPARTMENT_OTHER)
Admission: RE | Admit: 2014-12-20 | Discharge: 2014-12-20 | Disposition: A | Discharge status: Home / Self Care | Payer: Medicare Other | Source: Ambulatory Visit | Attending: Internal Medicine | Admitting: Internal Medicine

## 2014-12-20 ENCOUNTER — Encounter: Payer: Self-pay | Admitting: Internal Medicine

## 2014-12-20 ENCOUNTER — Other Ambulatory Visit: Payer: Self-pay | Admitting: *Deleted

## 2014-12-20 ENCOUNTER — Ambulatory Visit (INDEPENDENT_AMBULATORY_CARE_PROVIDER_SITE_OTHER): Payer: Medicare Other | Admitting: Internal Medicine

## 2014-12-20 VITALS — BP 133/77 | HR 68 | Temp 97.6°F | Wt 183.4 lb

## 2014-12-20 DIAGNOSIS — I4891 Unspecified atrial fibrillation: Secondary | ICD-10-CM

## 2014-12-20 DIAGNOSIS — K7460 Unspecified cirrhosis of liver: Secondary | ICD-10-CM | POA: Insufficient documentation

## 2014-12-20 DIAGNOSIS — J90 Pleural effusion, not elsewhere classified: Secondary | ICD-10-CM | POA: Insufficient documentation

## 2014-12-20 DIAGNOSIS — I350 Nonrheumatic aortic (valve) stenosis: Secondary | ICD-10-CM

## 2014-12-20 DIAGNOSIS — E876 Hypokalemia: Secondary | ICD-10-CM

## 2014-12-20 DIAGNOSIS — K7469 Other cirrhosis of liver: Secondary | ICD-10-CM

## 2014-12-20 DIAGNOSIS — R945 Abnormal results of liver function studies: Secondary | ICD-10-CM

## 2014-12-20 DIAGNOSIS — E785 Hyperlipidemia, unspecified: Secondary | ICD-10-CM

## 2014-12-20 DIAGNOSIS — R7989 Other specified abnormal findings of blood chemistry: Secondary | ICD-10-CM

## 2014-12-20 MED ORDER — POTASSIUM CHLORIDE CRYS ER 10 MEQ PO TBCR: 10.0000 meq | EXTENDED_RELEASE_TABLET | Freq: Two times a day (BID) | ORAL | Status: DC

## 2014-12-20 NOTE — Assessment & Plan Note (Signed)
Holding Lipitor due to increased LFTs

## 2014-12-20 NOTE — Assessment & Plan Note (Signed)
Patient recently diagnosed with persistent atrial fibrillation, status post admission, currently rate is satisfactory with metoprolol, anticoagulated with Coumadin,  Xarelto was discontinued as it was suspected to be the cause of increased LFTs. Plan: Follow-up with cardiology, plan is to try to cardiovert her in 4 weeks if possible

## 2014-12-20 NOTE — Progress Notes (Signed)
Pre visit review using our clinic review tool, if applicable. No additional management support is needed unless otherwise documented below in the visit note. 

## 2014-12-20 NOTE — Assessment & Plan Note (Addendum)
During recent hospital admissions, LFTs were elevated, ultrasound showed changes consistent of cirrhosis. The patient takes 1 glass of wine daily but denies any heavier intake than that. On exam there is no stigmata of cirrhosis, albumin and platelets are okay, PT is slightly elevated. Patient is holding Lipitor due to increased LFTs, they are trending down. Plan: to see GI soon.

## 2014-12-20 NOTE — Patient Instructions (Signed)
  Stop by the first floor and get the XR  See you in April

## 2014-12-20 NOTE — Assessment & Plan Note (Signed)
Found to have bilateral pleural effusions, felt to be due to atrial fibrillation. She also has a persistent dry cough likely related to this issue. Plan: Check a chest x-ray, if not improving consider increase lasix

## 2014-12-20 NOTE — Assessment & Plan Note (Addendum)
See comments under cirrhosis

## 2014-12-20 NOTE — Progress Notes (Signed)
Subjective:    Patient ID: [name], female    DOB: 1935-08-17, 80 y.o.   MRN: [id number]  DOS:  12/20/2014 Type of visit - description : Hospital follow-up Interval history: Since the last time she was here, she was admitted to the hospital from 12/15/2014 to  12/17/2014. She had recent onset of atrial fibrillation, CHF, difficulty breathing likely due to bilateral pleural effusions. She was diurese, troponins were negative, echocardiogram showing EF of 50-55%. Also had transaminitis, Lipitor held. Ultrasound showed cholelithiasis without cholecystitis and some changes consistent with cirrhosis. Subsequently, she was seen at the cardiology office 12/19/2014--rate was well controlled, due to increased LFTs xarelto was switch to Coumadin. Labs reviewed: Last creatinine okay, last potassium low, cardiology increase potassium supplements. Last CBC show a slightly increased WBC. LFTs trending down  ROS At this point she is feeling slightly better. Difficulty breathing about the same but cough has decrease. Denies chest pain or lower extremity edema No nausea, vomiting, diarrhea or blood in the stools. No gross hematuria   Past Medical History  Diagnosis Date  . Epilepsy, focal August 2012    Initially diagnosed as TIA   . Essential hypertension   . Hyperlipidemia   . PVD (peripheral vascular disease)     Saw Dr Albertine Patricia 2007 - right common Iliac artery stenosis  . Uterine cancer   . Breast CA     Bilateral  . Atrial fibrillation     Recently diagnosed  . Aortic stenosis     Mild 2014     Past Surgical History  Procedure Laterality Date  . Mastectomy Bilateral 2007  . Total abdominal hysterectomy  1990s    History   Social History  . Marital Status: Married    Spouse Name: N/A    Number of Children: N/A  . Years of Education: N/A   Occupational History  . Not on file.   Social History Main Topics  . Smoking status: Never Smoker   . Smokeless tobacco: Never  Used  . Alcohol Use: Yes     Comment: 2 glasses of wine daily  . Drug Use: No  . Sexual Activity: Not Currently   Other Topics Concern  . Not on file   Social History Narrative        Medication List       This list is accurate as of: 12/20/14  6:33 PM.  Always use your most recent med list.               aspirin EC 81 MG tablet  Take 81 mg by mouth daily.     benzonatate 100 MG capsule  Commonly known as:  TESSALON  Take 1 capsule (100 mg total) by mouth 3 (three) times daily. For cough     furosemide 20 MG tablet  Commonly known as:  LASIX  Take 1 tablet (20 mg total) by mouth daily.     levETIRAcetam 750 MG tablet  Commonly known as:  KEPPRA  Take 750 mg by mouth 2 (two) times daily.     lisinopril 2.5 MG tablet  Commonly known as:  PRINIVIL,ZESTRIL  Take 1 tablet (2.5 mg total) by mouth daily.     metoprolol tartrate 25 MG tablet  Commonly known as:  LOPRESSOR  Take 1 tablet (25 mg total) by mouth 2 (two) times daily.     potassium chloride 10 MEQ tablet  Commonly known as:  K-DUR,KLOR-CON  Take 1 tablet (10 mEq total) by mouth  2 (two) times daily.     warfarin 5 MG tablet  Commonly known as:  COUMADIN  Take 1 tablet (5 mg total) by mouth daily.           Objective:   Physical Exam BP 133/77 mmHg  Pulse 68  Temp(Src) 97.6 F (36.4 C) (Oral)  Wt 183 lb 6 oz (83.178 kg)  SpO2 99% General -- alert, well-developed, NAD.  Neck --no JVD at 45 HEENT-- Not pale.  Skin--  Chest wall without telangiectasias Lung s -- normal respiratory effort, no intercostal retractions, no accessory muscle use, and question of slightly decreased breath sound at bases.  Heart-- irregular, + murmur.  Abdomen-- Not distended, good bowel sounds,soft, non-tender. Extremities-- no pretibial edema bilaterally  Neurologic--  alert & oriented X3.   Psych-- Cognition and judgment appear intact. Cooperative with normal attention span and concentration. No anxious or  depressed appearing.      Assessment & Plan:

## 2014-12-20 NOTE — Assessment & Plan Note (Signed)
Has aortic stenosis, follow-up by cardiology

## 2014-12-21 ENCOUNTER — Ambulatory Visit: Payer: Medicare Other | Admitting: Cardiovascular Disease

## 2014-12-21 ENCOUNTER — Institutional Professional Consult (permissible substitution): Payer: Medicare Other | Admitting: Internal Medicine

## 2014-12-21 NOTE — Addendum Note (Signed)
Addended by: Kathlene November E on: 12/21/2014 09:31 AM   Modules accepted: Level of Service

## 2014-12-22 ENCOUNTER — Encounter: Payer: Self-pay | Admitting: Physician Assistant

## 2014-12-22 ENCOUNTER — Telehealth: Payer: Self-pay | Admitting: *Deleted

## 2014-12-22 ENCOUNTER — Encounter: Payer: Self-pay | Admitting: Cardiovascular Disease

## 2014-12-22 ENCOUNTER — Encounter: Payer: Self-pay | Admitting: Internal Medicine

## 2014-12-22 ENCOUNTER — Encounter: Payer: Self-pay | Admitting: Nurse Practitioner

## 2014-12-22 DIAGNOSIS — R945 Abnormal results of liver function studies: Principal | ICD-10-CM

## 2014-12-22 DIAGNOSIS — R7989 Other specified abnormal findings of blood chemistry: Secondary | ICD-10-CM

## 2014-12-22 NOTE — Telephone Encounter (Signed)
Spoke with husband and scheduled labs

## 2014-12-22 NOTE — Telephone Encounter (Signed)
-----   Message from Burtis Junes, NP sent at 12/22/2014  4:59 PM EST ----- Can you let them know that I got the My Chart message.  I am going to ask the pharmacist to give an opinion.  Can you tell them that she needs another HPF on the day of her coumadin check?  Cecille Rubin

## 2014-12-22 NOTE — Telephone Encounter (Signed)
Spoke with the spouse, Mr Figeroa and rescheduled the appointment to 12/26/14. He agrees to this change. Encourage to go to the ER if she acutely worsens.

## 2014-12-25 ENCOUNTER — Other Ambulatory Visit (INDEPENDENT_AMBULATORY_CARE_PROVIDER_SITE_OTHER): Payer: Medicare Other | Admitting: *Deleted

## 2014-12-25 ENCOUNTER — Ambulatory Visit (INDEPENDENT_AMBULATORY_CARE_PROVIDER_SITE_OTHER): Payer: Medicare Other

## 2014-12-25 ENCOUNTER — Encounter: Payer: Self-pay | Admitting: Nurse Practitioner

## 2014-12-25 DIAGNOSIS — I481 Persistent atrial fibrillation: Secondary | ICD-10-CM

## 2014-12-25 DIAGNOSIS — R7989 Other specified abnormal findings of blood chemistry: Secondary | ICD-10-CM

## 2014-12-25 DIAGNOSIS — E876 Hypokalemia: Secondary | ICD-10-CM

## 2014-12-25 DIAGNOSIS — I4819 Other persistent atrial fibrillation: Secondary | ICD-10-CM

## 2014-12-25 DIAGNOSIS — Z5181 Encounter for therapeutic drug level monitoring: Secondary | ICD-10-CM

## 2014-12-25 DIAGNOSIS — R945 Abnormal results of liver function studies: Secondary | ICD-10-CM

## 2014-12-25 LAB — BASIC METABOLIC PANEL
BUN: 15 mg/dL (ref 6–23)
CO2: 24 mEq/L (ref 19–32)
Calcium: 9.1 mg/dL (ref 8.4–10.5)
Chloride: 106 mEq/L (ref 96–112)
Creatinine, Ser: 0.93 mg/dL (ref 0.40–1.20)
GFR: 62.05 mL/min (ref >60.00)
Glucose, Bld: 128 mg/dL — ABNORMAL HIGH (ref 70–99)
Potassium: 4.1 mEq/L (ref 3.5–5.1)
Sodium: 137 mEq/L (ref 135–145)

## 2014-12-25 LAB — HEPATIC FUNCTION PANEL
ALT: 62 U/L — ABNORMAL HIGH (ref 0–35)
AST: 36 U/L (ref 0–37)
Albumin: 3.9 g/dL (ref 3.5–5.2)
Alkaline Phosphatase: 188 U/L — ABNORMAL HIGH (ref 39–117)
Bilirubin, Direct: 0.3 mg/dL (ref 0.0–0.3)
Total Bilirubin: 0.7 mg/dL (ref 0.2–1.2)
Total Protein: 6.8 g/dL (ref 6.0–8.3)

## 2014-12-25 LAB — CBC WITH DIFFERENTIAL/PLATELET
Basophils Absolute: 0.1 10*3/uL (ref 0.0–0.1)
Basophils Relative: 0.6 % (ref 0.0–3.0)
Eosinophils Absolute: 0.1 10*3/uL (ref 0.0–0.7)
Eosinophils Relative: 0.9 % (ref 0.0–5.0)
HCT: 47.9 % — ABNORMAL HIGH (ref 36.0–46.0)
Hemoglobin: 15.3 g/dL — ABNORMAL HIGH (ref 12.0–15.0)
Lymphocytes Relative: 13.6 % (ref 12.0–46.0)
Lymphs Abs: 1.4 10*3/uL (ref 0.7–4.0)
MCHC: 32 g/dL (ref 30.0–36.0)
MCV: 90.4 fl (ref 78.0–100.0)
Monocytes Absolute: 1.5 10*3/uL — ABNORMAL HIGH (ref 0.1–1.0)
Monocytes Relative: 14.1 % — ABNORMAL HIGH (ref 3.0–12.0)
Neutro Abs: 7.3 10*3/uL (ref 1.4–7.7)
Neutrophils Relative %: 70.8 % (ref 43.0–77.0)
Platelets: 281 10*3/uL (ref 150.0–400.0)
RBC: 5.3 Mil/uL — ABNORMAL HIGH (ref 3.87–5.11)
RDW: 13.8 % (ref 11.5–15.5)
WBC: 10.3 10*3/uL (ref 4.0–10.5)

## 2014-12-25 LAB — POCT INR: INR: 3.3

## 2014-12-25 NOTE — Telephone Encounter (Signed)
Pt's husband states that pt is not feeling better. Her main complaint is fatigue. Pt's husband asking if there is any other treatment that pt should have that might help her feel better.  Pt's husband advised I will forward to [facility] for review.

## 2014-12-25 NOTE — Patient Instructions (Signed)

## 2014-12-26 ENCOUNTER — Encounter: Payer: Self-pay | Admitting: Nurse Practitioner

## 2014-12-26 ENCOUNTER — Encounter: Payer: Self-pay | Admitting: Physician Assistant

## 2014-12-26 ENCOUNTER — Other Ambulatory Visit (INDEPENDENT_AMBULATORY_CARE_PROVIDER_SITE_OTHER): Payer: Medicare Other

## 2014-12-26 ENCOUNTER — Ambulatory Visit (INDEPENDENT_AMBULATORY_CARE_PROVIDER_SITE_OTHER): Payer: Medicare Other | Admitting: Physician Assistant

## 2014-12-26 VITALS — BP 140/80 | HR 76 | Ht 65.0 in | Wt 187.4 lb

## 2014-12-26 DIAGNOSIS — K7460 Unspecified cirrhosis of liver: Secondary | ICD-10-CM

## 2014-12-26 DIAGNOSIS — R7989 Other specified abnormal findings of blood chemistry: Secondary | ICD-10-CM

## 2014-12-26 DIAGNOSIS — R945 Abnormal results of liver function studies: Secondary | ICD-10-CM

## 2014-12-26 LAB — AMMONIA: AMMONIA: 29 umol/L (ref 11–35)

## 2014-12-26 NOTE — Progress Notes (Signed)
Agree with initial assessment and plans 

## 2014-12-26 NOTE — Patient Instructions (Addendum)
Please go to the basement level to have your labs drawn.  You will come back on 01-09-2015 to our lab , basement level for labs for the Liver function blood test.   You have been scheduled for a CT scan of the abdomen and pelvis at [place] ([address]---this is in the same building as Press photographer).   You are scheduled on 12-28-2014 at 11:30 am . You should arrive at 11:15 am  prior to your appointment time for registration. Please follow the written instructions below on the day of your exam:  WARNING: IF YOU ARE ALLERGIC TO IODINE/X-RAY DYE, PLEASE NOTIFY RADIOLOGY IMMEDIATELY AT [phone]! YOU WILL BE GIVEN A 13 HOUR PREMEDICATION PREP.  1) Do not eat or drink anything after 9:30 am  (4 hours prior to your test) 2) You have been given 2 bottles of oral contrast to drink. The solution may taste better if refrigerated, but do NOT add ice or any other liquid to this solution. Shake  well before drinking.    Drink 1 bottle of contrast @ 9:30 am (2 hours prior to your exam)  Drink 1 bottle of contrast @ 10:30 am  (1 hour prior to your exam)  You may take any medications as prescribed with a small amount of water except for the following: Metformin, Glucophage, Glucovance, Avandamet, Riomet, Fortamet, Actoplus Met, Janumet, Glumetza or Metaglip. The above medications must be held the day of the exam AND 48 hours after the exam.  The purpose of you drinking the oral contrast is to aid in the visualization of your intestinal tract. The contrast solution may cause some diarrhea. Before your exam is started, you will be given a small amount of fluid to drink. Depending on your individual set of symptoms, you may also receive an intravenous injection of x-ray contrast/dye. Plan on being at [facility] for 30 minutes or long, depending on the type of exam you are having performed.  If you have any questions regarding your exam or if you need to reschedule, you may call  the CT department at [phone] between the hours of 8:00 am and 5:00 pm, Monday-Friday.  ________________________________________________________________________

## 2014-12-26 NOTE — Progress Notes (Signed)
Patient ID: [name], female   DOB: 1935-05-13, 80 y.o.   MRN: [id number]   Subjective:    Patient ID: [name], female    DOB: Apr 27, 1935, 80 y.o.   MRN: [id number]  HPI [name] is a 80 year old white female known remotely to Dr. Henrene Pierce who is referred today by Katie Merle NP/Dr. Olive Pierce and cardiology for evaluation of elevated LFTs and probable cirrhosis. Patient has multiple medical problems. She had a recent hospitalization 18 through 12/17/2014 with finding of new atrial fibrillation and congestive heart failure. His was initially discovered outpatient and she was started on Xarelto and Lopressor then had progressive weakness and shortness of breath is admitted to the hospital. That time she was noted to have elevated LFTs. She was diuresed, the Xarelto was stopped and she is now on Coumadin. She is feeling better but still has significant fatigue. Workup in the hospital with upper abdominal ultrasound showed small gallstones gallbladder wall of 5 mm and a nodular liver concerning for underlying cirrhosis. BNP during hospitalization was in the 300-400 range. Her liver tests at the time of admission showed total bili of 1.2 alkaline phosphatase 163 AST of 223 and ALT of 205. Labs were repeated yesterday and improved total bili 0.7 alkaline phosphatase 188 AST of 36 and ALT of 62. Her platelet count is normal and INR was also normal prior to starting Coumadin. [name] Patient has no prior history of liver disease that she is aware of. Her husband is most of the talking and gives a very good history and states that she had breast cancer in 1998 and had extensive chemotherapy at that time and then took tamoxifen for 5 years after that. To been on Keppra over the past 2-1/2 years ago for a seizure disorder. He thinks that both of these things may have contributed to development of cirrhosis. She also has been drinking one to 2 glasses of wine per day long-term.  Review of Systems Pertinent positive  and negative review of systems were noted in the above HPI section.  All other review of systems was otherwise negative.  Outpatient Encounter Prescriptions as of 12/26/2014  Medication Sig  . aspirin EC 81 MG tablet Take 81 mg by mouth daily.  . benzonatate (TESSALON) 100 MG capsule Take 1 capsule (100 mg total) by mouth 3 (three) times daily. For cough  . furosemide (LASIX) 20 MG tablet Take 1 tablet (20 mg total) by mouth daily.  Marland Kitchen HYDROcodone-homatropine (HYCODAN) 5-1.5 MG/5ML syrup as needed.  . levETIRAcetam (KEPPRA) 750 MG tablet Take 750 mg by mouth 2 (two) times daily.  Marland Kitchen lisinopril (PRINIVIL,ZESTRIL) 2.5 MG tablet Take 1 tablet (2.5 mg total) by mouth daily.  . metoprolol tartrate (LOPRESSOR) 25 MG tablet Take 1 tablet (25 mg total) by mouth 2 (two) times daily.  . potassium chloride (K-DUR,KLOR-CON) 10 MEQ tablet Take 1 tablet (10 mEq total) by mouth 2 (two) times daily.  Marland Kitchen warfarin (COUMADIN) 5 MG tablet Take 1 tablet (5 mg total) by mouth daily.   No Known Allergies Patient Active Problem List   Diagnosis Date Noted  . Encounter for therapeutic drug monitoring 12/25/2014  . Hepatic cirrhosis 12/20/2014  . Pleural effusion 12/15/2014  . SOB (shortness of breath) 12/15/2014  . Persistent atrial fibrillation 12/15/2014  . Calculus of gallbladder without cholecystitis without obstruction 12/15/2014  . Atrial fibrillation 12/12/2014  . Annual physical exam 10/05/2014  . Pulsatile neck mass 07/14/2013  . Aortic stenosis 12/28/2012  . Hematochezia ? 12/28/2012  .  Seizure 09/08/2011  . HEARING DEFICIT 06/20/2008  . Hyperlipidemia 10/22/2007  . PVD 10/22/2007  . OSTEOPENIA 10/22/2007  . Malignant neoplasm of female breast 04/15/2007  . HTN (hypertension) 04/15/2007  . UTERINE CANCER, HX OF 04/15/2007   History   Social History  . Marital Status: Married    Spouse Name: N/A    Number of Children: 4  . Years of Education: N/A   Occupational History  . retired     Social History Main Topics  . Smoking status: Never Smoker   . Smokeless tobacco: Never Used  . Alcohol Use: Yes     Comment: 2 glasses of wine daily  . Drug Use: No  . Sexual Activity: Not Currently   Other Topics Concern  . Not on file   Social History Narrative    [name] family history includes Alzheimer's disease in her brother; Breast cancer in her daughter and other; Diabetes in her brother; Heart attack (age of onset: 57) in her father; Heart failure in her brother; Hypertension in her mother and sister; Kidney cancer in her brother; Kidney failure (age of onset: 29) in her mother; Seizures in her brother.      Objective:    Filed Vitals:   12/26/14 0959  BP: 140/80  Pulse: 76    Physical Exam  well-developed older white female in no acute distress, wearing sunglasses accompanied by her husband. Blood pressure 140/80 pulse 76 height 5 foot 5 weight 187. HEENT; nontraumatic normocephalic EOMI PERRLA air anicteric, Supple; no JVD, Cardiovascular ;regular rate and rhythm with W0-9 soft systolic murmur pulmonary clear bilaterally, Abdomen ;soft, nontender nondistended, no fluid wave ,no palpable hepatosplenomegaly bowel sounds are present, Rectal; exam not done, Extremities; trace edema in the ankles mild palmar erythema, Psych;  mood and affect appropriate- no asterixis       Assessment & Plan:   #1 80 yo female with new Dx of cirrhosis by [place]. This appears to be compensated with preserved synthetic function. Etiology may be multifactorial with previous extensive chemotherapy. Question component secondary to EtOH, we will also rule out other underlying chronic sources of liver disease. #2 acute transaminitis-resolving possible that this was related to Xarelto  though, may also have been component of passive congestion with acute congestive heart failure #3 new atrial fibrillation #4 congestive heart failure #5 history of seizure disorder #6 history of breast  cancer #7 peripheral vascular disease #8 hypertension #9 hyperlipidemia #10 history of uterine CA #11 gallstones  Plan; schedule for CT scan of the abdomen and pelvis We'll check hepatitis serologies, autoimmune markers and ceruloplasmin and alpha-1 antitrypsin level and alpha-fetoprotein level. We'll also check ammonia level and EtOH level Advised patient stopped drinking any alcohol given new diagnosis of cirrhosis Repeat hepatic panel in 2 weeks Follow-up office visit with Dr. Henrene Pierce in 3-4 weeks to review above workup and decide if any other GI workup  indicated.    Rajah Tagliaferro S Drayce Tawil PA-C 12/26/2014

## 2014-12-27 ENCOUNTER — Encounter: Payer: Self-pay | Admitting: Nurse Practitioner

## 2014-12-27 ENCOUNTER — Telehealth: Payer: Self-pay

## 2014-12-27 ENCOUNTER — Encounter: Payer: Self-pay | Admitting: Internal Medicine

## 2014-12-27 LAB — ETHANOL: Alcohol, Ethyl (B): <10 mg/dL (ref 0–10)

## 2014-12-27 NOTE — Telephone Encounter (Signed)
They will do the CT as a Liver Protocol in order to evaluate her cirrhosis.

## 2014-12-28 ENCOUNTER — Ambulatory Visit (INDEPENDENT_AMBULATORY_CARE_PROVIDER_SITE_OTHER)
Admission: RE | Admit: 2014-12-28 | Discharge: 2014-12-28 | Disposition: A | Discharge status: Home / Self Care | Payer: Medicare Other | Source: Ambulatory Visit | Attending: Physician Assistant | Admitting: Physician Assistant

## 2014-12-28 ENCOUNTER — Encounter: Payer: Self-pay | Admitting: Physician Assistant

## 2014-12-28 DIAGNOSIS — R945 Abnormal results of liver function studies: Secondary | ICD-10-CM

## 2014-12-28 DIAGNOSIS — K7460 Unspecified cirrhosis of liver: Secondary | ICD-10-CM

## 2014-12-28 DIAGNOSIS — R7989 Other specified abnormal findings of blood chemistry: Secondary | ICD-10-CM

## 2014-12-28 LAB — HEPATITIS B SURFACE ANTIBODY,QUALITATIVE

## 2014-12-28 LAB — HEPATITIS C ANTIBODY

## 2014-12-28 LAB — CERULOPLASMIN: Ceruloplasmin: 43 mg/dL (ref 18–53)

## 2014-12-28 LAB — ANA: Anti Nuclear Antibody(ANA): NEGATIVE

## 2014-12-28 LAB — AFP TUMOR MARKER

## 2014-12-28 LAB — MITOCHONDRIAL/SMOOTH MUSCLE AB PNL: Mitochondrial M2 Ab, IgG: 0.16 (ref <0.91)

## 2014-12-28 MED ORDER — IOHEXOL 300 MG/ML  SOLN
100.0000 mL | Freq: Once | INTRAMUSCULAR | Status: AC | PRN
  Administered 2014-12-28: 100 mL via INTRAVENOUS

## 2015-01-01 ENCOUNTER — Ambulatory Visit (INDEPENDENT_AMBULATORY_CARE_PROVIDER_SITE_OTHER): Payer: Medicare Other

## 2015-01-01 ENCOUNTER — Ambulatory Visit: Payer: Medicare Other | Admitting: Physician Assistant

## 2015-01-01 ENCOUNTER — Encounter: Payer: Self-pay | Admitting: Internal Medicine

## 2015-01-01 ENCOUNTER — Encounter: Payer: Self-pay | Admitting: Nurse Practitioner

## 2015-01-01 ENCOUNTER — Encounter: Payer: Self-pay | Admitting: Physician Assistant

## 2015-01-01 DIAGNOSIS — I481 Persistent atrial fibrillation: Secondary | ICD-10-CM

## 2015-01-01 DIAGNOSIS — I4819 Other persistent atrial fibrillation: Secondary | ICD-10-CM

## 2015-01-01 DIAGNOSIS — Z5181 Encounter for therapeutic drug level monitoring: Secondary | ICD-10-CM

## 2015-01-01 LAB — POCT INR: INR: 5.6

## 2015-01-02 ENCOUNTER — Other Ambulatory Visit: Payer: Self-pay

## 2015-01-02 ENCOUNTER — Telehealth: Payer: Self-pay | Admitting: Internal Medicine

## 2015-01-02 ENCOUNTER — Encounter: Payer: Self-pay | Admitting: Physician Assistant

## 2015-01-02 ENCOUNTER — Telehealth: Payer: Self-pay | Admitting: Cardiovascular Disease

## 2015-01-02 ENCOUNTER — Other Ambulatory Visit (INDEPENDENT_AMBULATORY_CARE_PROVIDER_SITE_OTHER): Payer: Medicare Other

## 2015-01-02 DIAGNOSIS — K7460 Unspecified cirrhosis of liver: Secondary | ICD-10-CM

## 2015-01-02 DIAGNOSIS — E876 Hypokalemia: Secondary | ICD-10-CM

## 2015-01-02 LAB — AMMONIA: AMMONIA: 29 umol/L (ref 11–35)

## 2015-01-02 LAB — HEPATITIS C ANTIBODY: HCV Ab: NEGATIVE

## 2015-01-02 LAB — HEPATIC FUNCTION PANEL
ALBUMIN: 3.7 g/dL (ref 3.5–5.2)
ALK PHOS: 131 U/L — AB (ref 39–117)
ALT: 28 U/L (ref 0–35)
AST: 26 U/L (ref 0–37)
BILIRUBIN DIRECT: 0.3 mg/dL (ref 0.0–0.3)
BILIRUBIN TOTAL: 1.1 mg/dL (ref 0.2–1.2)
Total Protein: 6.7 g/dL (ref 6.0–8.3)

## 2015-01-02 LAB — HEPATITIS B SURFACE ANTIBODY,QUALITATIVE: Hep B S Ab: NEGATIVE

## 2015-01-02 LAB — AFP TUMOR MARKER: AFP TUMOR MARKER: 4.9 ng/mL (ref <6.1)

## 2015-01-02 MED ORDER — POTASSIUM CHLORIDE CRYS ER 10 MEQ PO TBCR: 10.0000 meq | EXTENDED_RELEASE_TABLET | Freq: Two times a day (BID) | ORAL | Status: DC

## 2015-01-02 NOTE — Telephone Encounter (Signed)
Walk in pt form " meds running low" needs refill gave to christine

## 2015-01-02 NOTE — Telephone Encounter (Signed)
Verified medications with patient's husband.  Thayer Jew expressed concern that some of the medications were prescribed at the hospital and would therefore not be documented in our office.  Verified medications and notified husband that they are all recorded in our system.  Patient has office visit with cardiology on 01/11/15 and states he will speak with them about her medications as well.  eal

## 2015-01-02 NOTE — Telephone Encounter (Signed)
Caller name: walter  Relation to pt: spouse Call back number: [phone] Pharmacy:  Reason for call:   Patient and patient husband has questions regarding patient current medications. Patient wants to know what medication she should be on? Patient is on several different medications prescribed by several different doctors.

## 2015-01-03 ENCOUNTER — Encounter: Payer: Self-pay | Admitting: Internal Medicine

## 2015-01-03 ENCOUNTER — Telehealth: Payer: Self-pay | Admitting: *Deleted

## 2015-01-03 ENCOUNTER — Other Ambulatory Visit: Payer: Self-pay | Admitting: *Deleted

## 2015-01-03 DIAGNOSIS — E876 Hypokalemia: Secondary | ICD-10-CM

## 2015-01-03 LAB — ETHANOL: Alcohol, Ethyl (B): <10 mg/dL (ref 0–10)

## 2015-01-03 LAB — ANA: Anti Nuclear Antibody(ANA): NEGATIVE

## 2015-01-03 LAB — ANTI-SMOOTH MUSCLE ANTIBODY, IGG: SMOOTH MUSCLE AB: 6 U (ref <20)

## 2015-01-03 MED ORDER — LISINOPRIL 2.5 MG PO TABS: 2.5000 mg | ORAL_TABLET | Freq: Every day | ORAL | Status: DC

## 2015-01-03 MED ORDER — POTASSIUM CHLORIDE CRYS ER 10 MEQ PO TBCR: 10.0000 meq | EXTENDED_RELEASE_TABLET | Freq: Every day | ORAL | Status: DC

## 2015-01-03 MED ORDER — FUROSEMIDE 20 MG PO TABS: 20.0000 mg | ORAL_TABLET | Freq: Every day | ORAL | Status: DC

## 2015-01-03 NOTE — Telephone Encounter (Signed)
MEDS REORDERED  PER  HUSBANDS  REQUEST .Adonis Housekeeper

## 2015-01-04 LAB — CERULOPLASMIN: CERULOPLASMIN: 41 mg/dL (ref 18–53)

## 2015-01-04 LAB — MITOCHONDRIAL ANTIBODIES: Mitochondrial M2 Ab, IgG: 0.11 (ref <0.91)

## 2015-01-08 ENCOUNTER — Ambulatory Visit (INDEPENDENT_AMBULATORY_CARE_PROVIDER_SITE_OTHER): Payer: Medicare Other | Admitting: *Deleted

## 2015-01-08 DIAGNOSIS — I4819 Other persistent atrial fibrillation: Secondary | ICD-10-CM

## 2015-01-08 DIAGNOSIS — I481 Persistent atrial fibrillation: Secondary | ICD-10-CM

## 2015-01-08 DIAGNOSIS — Z5181 Encounter for therapeutic drug level monitoring: Secondary | ICD-10-CM

## 2015-01-08 LAB — POCT INR: INR: 2.2

## 2015-01-09 NOTE — Progress Notes (Signed)
Patient ID: [name], female   DOB: March 08, 1935, 80 y.o.   MRN: [id number]   [name] Date of Birth: [birth year] Medical Record #[id number]  History of Present Illness: [name] is seen post PA visit on 12/14/14  . She is a 80 y.o.  female with new onset AF - on lopressor and Xarelto by PCP. Other issues include HLD, HTN, PVD with right common iliac artery stenosis, prior uterine and breast cancer, and seizure disorder. Her CHADSVASC is 5.    Apparently not feeling well for past several months - lots of fatigue and a cough. Ended up going to ER - refused admission. Then became more dyspnea. Presented to the hospital earlier this month due to progressive dyspnea - found to have bilateral effusions and CHF felt to be due to her AF. She was diuresed. Echo with EF of 50 to 55% - moderate to severe AS noted. No clear cut evidence of tachycardia mediated CM. Noted during this admission to have elevated LFTs - cirrhosis mentioned on ultrasound - now off Lipitor. Remains in AF. Discharge plan unclear as to timing/sequence of any restoration of NSR, possible cath to further assess her valve, what GI work up is needed, etc. She is on Xarelto for her anticoagulation since January 5th.  She comes in today. She is here with her husband. She wishes that he does all the talking for her. She remains fatigued - has been for several months. No more shortness of breath. No chest pain. She has not had syncope. Very little in the way of palpitations. She continues to have daily alcohol use - 1 to 2 glasses of wine per day. No GI follow up noted. Off Lipitor. No bleeding or bruising noted.   Discussed benefits of [facility]  Risks including stroke , need for emergent pacing and intubation discussed Willing to proceed  Current Outpatient Prescriptions  Medication Sig Dispense Refill  . aspirin EC 81 MG tablet Take 81 mg by mouth daily.    . benzonatate (TESSALON) 100 MG capsule Take 1 capsule (100 mg total) by mouth 3  (three) times daily. For cough 30 capsule 0  . furosemide (LASIX) 20 MG tablet Take 1 tablet (20 mg total) by mouth daily. 90 tablet 3  . HYDROcodone-homatropine (HYCODAN) 5-1.5 MG/5ML syrup as needed.    . levETIRAcetam (KEPPRA) 750 MG tablet Take 750 mg by mouth 2 (two) times daily.    Marland Kitchen lisinopril (PRINIVIL,ZESTRIL) 2.5 MG tablet Take 1 tablet (2.5 mg total) by mouth daily. 90 tablet 3  . metoprolol tartrate (LOPRESSOR) 25 MG tablet Take 1 tablet (25 mg total) by mouth 2 (two) times daily. 60 tablet 1  . potassium chloride (K-DUR,KLOR-CON) 10 MEQ tablet Take 1 tablet (10 mEq total) by mouth daily. 180 tablet 3  . warfarin (COUMADIN) 5 MG tablet Take 1 tablet (5 mg total) by mouth daily. 90 tablet 3   No current facility-administered medications for this visit.    No Known Allergies  Past Medical History  Diagnosis Date  . Epilepsy, focal August 2012    Initially diagnosed as TIA   . Essential hypertension   . Hyperlipidemia   . PVD (peripheral vascular disease)     Saw Dr Albertine Patricia 2007 - right common Iliac artery stenosis  . Uterine cancer   . Breast CA     Bilateral  . Atrial fibrillation     Recently diagnosed  . Aortic stenosis     Mild 2014   .  Tachycardia   . CHF (congestive heart failure)   . Hepatic cirrhosis   . Heart murmur   . Colon polyps     Past Surgical History  Procedure Laterality Date  . Mastectomy Bilateral 2007  . Total abdominal hysterectomy  1990s  . Breast lumpectomy Bilateral     History  Smoking status  . Never Smoker   Smokeless tobacco  . Never Used    History  Alcohol Use  . Yes    Comment: 2 glasses of wine daily    Family History  Problem Relation Age of Onset  . Hypertension Mother   . Breast cancer Daughter     BRCA-1  . Heart attack Father 19  . Kidney failure Mother 30  . Heart failure Brother   . Diabetes Brother   . Kidney cancer Brother   . Seizures Brother   . Alzheimer's disease Brother   . Breast cancer Other      niece  . Hypertension Sister     Review of Systems: The review of systems is per the HPI.  All other systems were reviewed and are negative.  Physical Exam: There were no vitals taken for this visit.  BP 130/60 by me. Patient is alert and in no acute distress. Affect is pretty flat. Skin is warm and dry. Color is normal. Maybe slightly pale. HEENT is unremarkable. Normocephalic/atraumatic. PERRL. Sclera are nonicteric. Neck is supple. No masses. No JVD. Lungs are fairly clear. Cardiac exam shows an irregular rhythm. Rate ok. Harsh outflow murmur. Abdomen is soft. Extremities are without edema. Gait and ROM are intact. No gross neurologic deficits noted.  Wt Readings from Last 3 Encounters:  12/26/14 84.993 kg (187 lb 6 oz)  12/20/14 83.178 kg (183 lb 6 oz)  12/19/14 82.827 kg (182 lb 9.6 oz)    LABORATORY DATA/PROCEDURES: PENDING  Lab Results  Component Value Date   WBC 10.3 12/25/2014   HGB 15.3* 12/25/2014   HCT 47.9* 12/25/2014   PLT 281.0 12/25/2014   GLUCOSE 128* 12/25/2014   CHOL 277* 10/05/2014   TRIG 190.0* 10/05/2014   HDL 73.90 10/05/2014   LDLDIRECT 174.5 07/28/2013   LDLCALC 165* 10/05/2014   ALT 28 01/02/2015   AST 26 01/02/2015   NA 137 12/25/2014   K 4.1 12/25/2014   CL 106 12/25/2014   CREATININE 0.93 12/25/2014   BUN 15 12/25/2014   CO2 24 12/25/2014   TSH 1.72 12/12/2014   INR 2.2 01/08/2015    BNP (last 3 results) No results for input(s): PROBNP in the last 8760 hours.  Echo Study Conclusions from 12/2014  - Procedure narrative: Transthoracic echocardiography. Image quality was fair, with poor endocardial visualization. The study was technically difficult. - Left ventricle: The cavity size was normal. Wall thickness was increased in a pattern of mild LVH. Systolic function was normal. The estimated ejection fraction was in the range of 50% to 55%. Images were inadequate for LV wall motion assessment. The study was not  technically sufficient to allow evaluation of LV diastolic dysfunction due to atrial fibrillation. Doppler parameters are consistent with high ventricular filling pressure. - Aortic valve: Moderately calcified annulus. Moderately thickened, moderately calcified leaflets. Morphologically, there appears to be reduced cusp mobility with moderate to severe aortic stenosis. Peak velocities and mean gradients are likely underestimated. Mild eccentric regurgitation directed towards the anterior mitral leaflet. By planimetry, valve area is 0.83 cm squared. Peak velocity measured at 3.14 m/s. This is likely underestimated. Valve area (VTI): 1.24  cm^2. Valve area (Vmax): 1.19 cm^2. Valve area (Vmean): 1.19 cm^2. - Mitral valve: There was mild regurgitation. - Left atrium: The atrium was mildly to moderately dilated. - Right ventricle: The cavity size was normal. Wall thickness was normal. Systolic function was mildly reduced. - Right atrium: The atrium was moderately dilated. - Tricuspid valve: There was moderate regurgitation. - Pulmonary arteries: PA peak pressure: 47 mm Hg (S). Moderately elevated pulmonary pressures. - Systemic veins: IVC is dilated with normal respiratory variation. Estimated CVP 8 mmHg.   ECG:  12/15/14  afib rate 76 poor R wave progression 12/12/14  afib rate 118 poor R wave progression better rate control on most recent ECG    Ct Abdomen Pelvis W Wo Contrast  12/28/2014   CLINICAL DATA:  Evaluate for cirrhosis. History of breast cancer and bilateral mastectomy.  EXAM: CT ABDOMEN AND PELVIS WITHOUT AND WITH CONTRAST  TECHNIQUE: Multidetector CT imaging of the abdomen and pelvis was performed following the standard protocol before and following the bolus administration of intravenous contrast.  CONTRAST:  172m OMNIPAQUE IOHEXOL 300 MG/ML  SOLN  COMPARISON:  None.  FINDINGS: Lower chest: Enlargement of the right atrium and right ventricle identified.  There are bilateral pleural effusions right greater than left.  Hepatobiliary: Reflux of contrast material from the right atrium into the hepatic veins identified compatible with passive venous congestion. The contour of the liver is slightly irregular. No focal liver abnormality identified. No enhancing liver lesions identified to suggest hepatoma. The gallbladder appears normal. No biliary dilatation.  Pancreas: Normal appearance of the pancreas.  Spleen: The spleen is normal.  Adrenals/Urinary Tract: The adrenal glands are unremarkable. No focal kidney abnormality identified. The urinary bladder is unremarkable.  Stomach/Bowel: The stomach and the small bowel loops have a normal course and caliber. There is no evidence for bowel obstruction. Normal appearance of the colon.  Vascular/Lymphatic: Calcified atherosclerotic disease involves the abdominal aorta. No aneurysm. There is no upper abdominal adenopathy identified. No pelvic or inguinal adenopathy.  Reproductive: Previous hysterectomy.  No adnexal mass identified.  Other: Mild to moderate abdominal and pelvic ascites identified.  Musculoskeletal: Review of the visualized bony structures is negative for aggressive lytic or sclerotic bone lesion.  IMPRESSION: 1. 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 2. Suspect right heart failure. There is enlargement of the right atrium and right ventricle and reflux of contrast material into the hepatic veins consistent with passive venous congestion. Bilateral pleural effusions are present right greater than left.   Electronically Signed   By: TKerby MoorsM.D.   On: 12/28/2014 13:22   Dg Chest 2 View  12/20/2014   CLINICAL DATA:  Pleural effusion, atrial fibrillation, history of breast cancer  EXAM: CHEST  2 VIEW  COMPARISON:  12/15/2014  FINDINGS: Trace bilateral pleural effusions. Mild bilateral interstitial thickening. No focal consolidation or pneumothorax. Stable cardiomediastinal silhouette. Thoracic  aortic atherosclerosis. Surgical clips in bilateral axilla.  The osseous structures are unremarkable.  IMPRESSION: Trace bilateral pleural effusions and mild interstitial edema.   Electronically Signed   By: HKathreen Devoid  On: 12/20/2014 14:51   Dg Chest 2 View  12/15/2014   CLINICAL DATA:  80year old female with increasing shortness of breath for several days. Cough. Initial encounter.  EXAM: CHEST  2 VIEW  COMPARISON:  12/14/2014 and earlier.  FINDINGS: Continued opacity at both lung bases suggesting pleural effusions. No interval progression. Stable lung volumes. No pneumothorax, pulmonary edema, or new pulmonary opacity. Stable cardiac size and  mediastinal contours. Postoperative changes to the bilateral axilla and chest wall. No acute osseous abnormality identified.  IMPRESSION: Stable bilateral pleural effusions. No new cardiopulmonary abnormality.   Electronically Signed   By: Lars Pinks M.D.   On: 12/15/2014 16:20   Dg Chest 2 View  12/14/2014   CLINICAL DATA:  Cough for 2 weeks.  EXAM: CHEST  2 VIEW  COMPARISON:  PA and lateral chest 12/12/2014. Single view of the chest 04/08/2006.  FINDINGS: The patient is status post bilateral mastectomy and axillary dissection. There are small bilateral pleural effusions and basilar atelectasis. Small effusions appear slightly increased since the prior examination. Heart size is upper normal but there is no pulmonary edema. No pneumothorax is identified.  IMPRESSION: Small bilateral pleural effusions and basilar atelectasis so slight increase since the study 2 days ago.  Cardiomegaly without edema.   Electronically Signed   By: Inge Rise M.D.   On: 12/14/2014 11:12   Dg Chest 2 View  12/12/2014   CLINICAL DATA:  Persistent cough, new onset atrial fibrillation, history hypertension, breast cancer post BILATERAL mastectomy, uterine cancer  EXAM: CHEST  2 VIEW  COMPARISON:  04/08/2006  FINDINGS: Mild enlargement of cardiac silhouette.  Atherosclerotic  calcification aorta.  Mediastinal contours and pulmonary vascularity normal.  Prior BILATERAL breast reconstructions.  Bibasilar atelectasis versus scarring.  No acute infiltrate, definite pleural effusion or pneumothorax.  Bones demineralized.  IMPRESSION: Enlargement of cardiac silhouette.  Bibasilar atelectasis versus scarring.   Electronically Signed   By: Lavonia Dana M.D.   On: 12/12/2014 18:20   US Abdomen Complete  12/15/2014   CLINICAL DATA:  80 year old female with shortness breath, fatigue, cough for the past 2-4 weeks, and elevated liver function tests. Elevated white blood cell count.  EXAM: ULTRASOUND ABDOMEN COMPLETE  COMPARISON:  No priors.  FINDINGS: Gallbladder: 2 small echogenic foci with posterior acoustic shadowing in the neck of the gallbladder, compatible with small gallstones. Gallbladder appears only mildly distended. Gallbladder wall appears slightly edematous, measuring up to 5 mm in thickness. No pericholecystic fluid. Per report from the sonographer, the patient did not exhibit a sonographic Murphy's sign on examination.  Common bile duct: Diameter: 2.9 mm in the porta hepatis.  Liver: Slight nodular contour of the liver. No focal lesion identified. Within normal limits in parenchymal echogenicity. Bidirectional flow noted in the portal vein.  IVC: No abnormality visualized.  Pancreas: Visualized portion unremarkable.  Spleen: Size and appearance within normal limits.  5.4 cm in length.  Right Kidney: Length: 11.3 cm. Echogenicity within normal limits. 1.0 x 1.0 x 0.9 cm hypoechoic lesion with increased through transmission in the upper pole, likely a tiny cyst. No hydronephrosis visualized.  Left Kidney: Length: 11.1 cm. Echogenicity within normal limits. No mass or hydronephrosis visualized.  Abdominal aorta: No aneurysm visualized.  Other findings: Bilateral pleural effusions.  IMPRESSION: 1. Slightly nodular contour of the liver with bidirectional flow on the portal vein. These  findings may indicate underlying cirrhosis. 2. There are 2 small gallstones in the neck of the gallbladder. No definitive findings to suggest acute cholecystitis at this time. Mild gallbladder wall thickening may in part relate to under distention of the gallbladder, and could also be related to intrinsic hepatic disease. 3. Bilateral pleural effusions. 4. Small hypoechoic lesion in the upper pole of the right kidney is likely a small cyst.   Electronically Signed   By: Vinnie Langton M.D.   On: 12/15/2014 18:04    Assessment / Plan: 1.  New onset AF - due to abnormal LFT's changed over to coumadin  INR Rx last 4 weeks    Have called hospital and scheduled [facility] for 2/11  Will have repeat lab work On 2/10  Orders written   2. Progressive AS - reviewed by Dr. Angelena Form. Mean gradient 19. Felt to be moderate - will continue to follow for now. She may end up being a TAVR candidate in  Future given her comorbidities   3. Abnormal LFTs - cirrhosis seen by GI  Labs improved last checked w/u negative so far  Temporally related to xarelto      [employer]

## 2015-01-11 ENCOUNTER — Ambulatory Visit (INDEPENDENT_AMBULATORY_CARE_PROVIDER_SITE_OTHER): Payer: Medicare Other | Admitting: Cardiovascular Disease

## 2015-01-11 ENCOUNTER — Encounter: Payer: Self-pay | Admitting: *Deleted

## 2015-01-11 VITALS — BP 148/84 | HR 80 | Ht 67.0 in | Wt 185.1 lb

## 2015-01-11 DIAGNOSIS — Z01818 Encounter for other preprocedural examination: Secondary | ICD-10-CM

## 2015-01-11 NOTE — Patient Instructions (Signed)
Your physician has recommended that you have a Cardioversion (DCCV). Electrical Cardioversion uses a jolt of electricity to your heart either through paddles or wired patches attached to your chest. This is a controlled, usually prescheduled, procedure. Defibrillation is done under light anesthesia in the hospital, and you usually go home the day of the procedure. This is done to get your heart back into a normal rhythm. You are not awake for the procedure. Please see the instruction sheet given to you today.  01-18-15 Your physician recommends that you return for lab work in:  LAB  2-9 OR 10-16 Your physician recommends that you continue on your current medications as directed. Please refer to the Current Medication list given to you today.

## 2015-01-15 ENCOUNTER — Ambulatory Visit (INDEPENDENT_AMBULATORY_CARE_PROVIDER_SITE_OTHER): Payer: Medicare Other

## 2015-01-15 ENCOUNTER — Other Ambulatory Visit (INDEPENDENT_AMBULATORY_CARE_PROVIDER_SITE_OTHER): Payer: Medicare Other | Admitting: *Deleted

## 2015-01-15 DIAGNOSIS — I4819 Other persistent atrial fibrillation: Secondary | ICD-10-CM

## 2015-01-15 DIAGNOSIS — Z5181 Encounter for therapeutic drug level monitoring: Secondary | ICD-10-CM

## 2015-01-15 DIAGNOSIS — Z01818 Encounter for other preprocedural examination: Secondary | ICD-10-CM

## 2015-01-15 DIAGNOSIS — I481 Persistent atrial fibrillation: Secondary | ICD-10-CM

## 2015-01-15 LAB — CBC WITH DIFFERENTIAL/PLATELET
Basophils Absolute: 0 10*3/uL (ref 0.0–0.1)
Basophils Relative: 0.3 % (ref 0.0–3.0)
EOS ABS: 0.1 10*3/uL (ref 0.0–0.7)
Eosinophils Relative: 1.4 % (ref 0.0–5.0)
HEMATOCRIT: 47.4 % — AB (ref 36.0–46.0)
Hemoglobin: 15.5 g/dL — ABNORMAL HIGH (ref 12.0–15.0)
LYMPHS PCT: 12.5 % (ref 12.0–46.0)
Lymphs Abs: 1.1 10*3/uL (ref 0.7–4.0)
MCHC: 32.6 g/dL (ref 30.0–36.0)
MCV: 86.2 fl (ref 78.0–100.0)
Monocytes Absolute: 1.2 10*3/uL — ABNORMAL HIGH (ref 0.1–1.0)
Monocytes Relative: 14.3 % — ABNORMAL HIGH (ref 3.0–12.0)
Neutro Abs: 6 10*3/uL (ref 1.4–7.7)
Neutrophils Relative %: 71.5 % (ref 43.0–77.0)
Platelets: 236 10*3/uL (ref 150.0–400.0)
RBC: 5.5 Mil/uL — AB (ref 3.87–5.11)
RDW: 15 % (ref 11.5–15.5)
WBC: 8.4 10*3/uL (ref 4.0–10.5)

## 2015-01-15 LAB — BASIC METABOLIC PANEL
BUN: 15 mg/dL (ref 6–23)
CALCIUM: 9.1 mg/dL (ref 8.4–10.5)
CO2: 29 mEq/L (ref 19–32)
Chloride: 102 mEq/L (ref 96–112)
Creatinine, Ser: 1.08 mg/dL (ref 0.40–1.20)
GFR: 52.21 mL/min — AB (ref >60.00)
Glucose, Bld: 128 mg/dL — ABNORMAL HIGH (ref 70–99)
POTASSIUM: 3.9 meq/L (ref 3.5–5.1)
Sodium: 139 mEq/L (ref 135–145)

## 2015-01-15 LAB — POCT INR: INR: 3.1

## 2015-01-17 ENCOUNTER — Other Ambulatory Visit: Payer: Self-pay | Admitting: Cardiovascular Disease

## 2015-01-18 ENCOUNTER — Encounter (HOSPITAL_COMMUNITY)
Admission: RE | Disposition: A | Discharge status: Home / Self Care | Payer: Self-pay | Source: Ambulatory Visit | Attending: Cardiovascular Disease

## 2015-01-18 ENCOUNTER — Ambulatory Visit (HOSPITAL_COMMUNITY): Payer: Medicare Other | Admitting: Anesthesiology

## 2015-01-18 ENCOUNTER — Encounter (HOSPITAL_COMMUNITY): Payer: Self-pay | Admitting: *Deleted

## 2015-01-18 ENCOUNTER — Ambulatory Visit (HOSPITAL_COMMUNITY)
Admission: RE | Admit: 2015-01-18 | Discharge: 2015-01-18 | Disposition: A | Discharge status: Home / Self Care | Payer: Medicare Other | Source: Ambulatory Visit | Attending: Cardiovascular Disease | Admitting: Cardiovascular Disease

## 2015-01-18 DIAGNOSIS — I4891 Unspecified atrial fibrillation: Secondary | ICD-10-CM | POA: Diagnosis present

## 2015-01-18 DIAGNOSIS — Z8249 Family history of ischemic heart disease and other diseases of the circulatory system: Secondary | ICD-10-CM | POA: Insufficient documentation

## 2015-01-18 DIAGNOSIS — I739 Peripheral vascular disease, unspecified: Secondary | ICD-10-CM | POA: Insufficient documentation

## 2015-01-18 DIAGNOSIS — Z853 Personal history of malignant neoplasm of breast: Secondary | ICD-10-CM | POA: Diagnosis not present

## 2015-01-18 DIAGNOSIS — I10 Essential (primary) hypertension: Secondary | ICD-10-CM | POA: Insufficient documentation

## 2015-01-18 DIAGNOSIS — Z8673 Personal history of transient ischemic attack (TIA), and cerebral infarction without residual deficits: Secondary | ICD-10-CM | POA: Insufficient documentation

## 2015-01-18 DIAGNOSIS — E785 Hyperlipidemia, unspecified: Secondary | ICD-10-CM | POA: Diagnosis not present

## 2015-01-18 DIAGNOSIS — I509 Heart failure, unspecified: Secondary | ICD-10-CM | POA: Diagnosis not present

## 2015-01-18 DIAGNOSIS — Z7982 Long term (current) use of aspirin: Secondary | ICD-10-CM | POA: Diagnosis not present

## 2015-01-18 DIAGNOSIS — Z7901 Long term (current) use of anticoagulants: Secondary | ICD-10-CM | POA: Diagnosis not present

## 2015-01-18 HISTORY — PX: CARDIOVERSION: SHX1299

## 2015-01-18 SURGERY — CARDIOVERSION
Anesthesia: Monitor Anesthesia Care

## 2015-01-18 MED ORDER — PROPOFOL 10 MG/ML IV BOLUS
INTRAVENOUS | Status: DC | PRN
  Administered 2015-01-18: 30 mg via INTRAVENOUS

## 2015-01-18 MED ORDER — ETOMIDATE 2 MG/ML IV SOLN
INTRAVENOUS | Status: DC | PRN
  Administered 2015-01-18: 10 mg via INTRAVENOUS

## 2015-01-18 MED ORDER — SODIUM CHLORIDE 0.9 % IV SOLN
INTRAVENOUS | Status: DC
  Administered 2015-01-18: 11:00:00 via INTRAVENOUS

## 2015-01-18 MED ORDER — LIDOCAINE HCL (CARDIAC) 20 MG/ML IV SOLN
INTRAVENOUS | Status: DC | PRN
  Administered 2015-01-18: 60 mg via INTRAVENOUS

## 2015-01-18 NOTE — Anesthesia Preprocedure Evaluation (Signed)
Anesthesia Evaluation  Patient identified by MRN, date of birth, ID band Patient awake    Reviewed: Allergy & Precautions, NPO status , Patient's Chart, lab work & pertinent test results  Airway Mallampati: II   Neck ROM: full    Dental   Pulmonary shortness of breath,          Cardiovascular hypertension, + Peripheral Vascular Disease and +CHF + dysrhythmias Atrial Fibrillation + Valvular Problems/Murmurs AS  Moderate to severe AS by recent TTE.   Neuro/Psych Seizures -,     GI/Hepatic (+) Cirrhosis -      ,   Endo/Other    Renal/GU      Musculoskeletal   Abdominal   Peds  Hematology   Anesthesia Other Findings   Reproductive/Obstetrics Breast CA                             Anesthesia Physical Anesthesia Plan  ASA: III  Anesthesia Plan: MAC   Post-op Pain Management:    Induction: Intravenous  Airway Management Planned: Mask  Additional Equipment:   Intra-op Plan:   Post-operative Plan:   Informed Consent: I have reviewed the patients History and Physical, chart, labs and discussed the procedure including the risks, benefits and alternatives for the proposed anesthesia with the patient or authorized representative who has indicated his/her understanding and acceptance.     Plan Discussed with: CRNA, Anesthesiologist and Surgeon  Anesthesia Plan Comments:         Anesthesia Quick Evaluation

## 2015-01-18 NOTE — Interval H&P Note (Signed)
History and Physical Interval Note:  01/18/2015 8:48 AM  [name]  has presented today for surgery, with the diagnosis of AFIB  The various methods of treatment have been discussed with the patient and family. After consideration of risks, benefits and other options for treatment, the patient has consented to  Procedure(s): CARDIOVERSION (N/A) as a surgical intervention .  The patient's history has been reviewed, patient examined, no change in status, stable for surgery.  I have reviewed the patient's chart and labs.  Questions were answered to the patient's satisfaction.     Jenkins Rouge

## 2015-01-18 NOTE — Anesthesia Postprocedure Evaluation (Signed)
  Anesthesia Post-op Note  Patient: [name]  Procedure(s) Performed: Procedure(s): CARDIOVERSION (N/A)  Patient Location: Endoscopy Unit  Anesthesia Type:MAC  Level of Consciousness: awake and alert   Airway and Oxygen Therapy: Patient Spontanous Breathing  Post-op Pain: none  Post-op Assessment: Post-op Vital signs reviewed, Patient's Cardiovascular Status Stable, Respiratory Function Stable and Patent Airway  Post-op Vital Signs: Reviewed and stable  Last Vitals:  Filed Vitals:   01/18/15 1108  BP: 154/65  Temp:   Resp: 30    Complications: No apparent anesthesia complications

## 2015-01-18 NOTE — Transfer of Care (Signed)
Immediate Anesthesia Transfer of Care Note  Patient: [name]  Procedure(s) Performed: Procedure(s): CARDIOVERSION (N/A)  Patient Location: Endoscopy Unit  Anesthesia Type:MAC  Level of Consciousness: sedated  Airway & Oxygen Therapy: Patient Spontanous Breathing  Post-op Assessment: Report given to RN, Post -op Vital signs reviewed and stable and Patient moving all extremities X 4  Post vital signs: Reviewed and stable  Last Vitals:  Filed Vitals:   01/18/15 0908  BP: 159/56  Temp: 36.3 C  Resp: 20    Complications: No apparent anesthesia complications

## 2015-01-18 NOTE — Discharge Instructions (Addendum)
F/U Dr Johnsie Cancel office will call Continue coumadin

## 2015-01-18 NOTE — CV Procedure (Signed)
DCC: Afib rate 88 Etomidate and Propofol 10/40 Anesthesia Shock x 1 150 J biphasic  Converted to NSR rate 88 No immediate neurologic sequelae  INR Rx 3.1  Jenkins Rouge

## 2015-01-18 NOTE — H&P (View-Only) (Signed)
Patient ID: [name], female   DOB: March 08, 1935, 80 y.o.   MRN: [id number]   [name] Date of Birth: [birth year] Medical Record #[id number]  History of Present Illness: [name] is seen post PA visit on 12/14/14  . She is a 80 y.o.  female with new onset AF - on lopressor and Xarelto by PCP. Other issues include HLD, HTN, PVD with right common iliac artery stenosis, prior uterine and breast cancer, and seizure disorder. Her CHADSVASC is 5.    Apparently not feeling well for past several months - lots of fatigue and a cough. Ended up going to ER - refused admission. Then became more dyspnea. Presented to the hospital earlier this month due to progressive dyspnea - found to have bilateral effusions and CHF felt to be due to her AF. She was diuresed. Echo with EF of 50 to 55% - moderate to severe AS noted. No clear cut evidence of tachycardia mediated CM. Noted during this admission to have elevated LFTs - cirrhosis mentioned on ultrasound - now off Lipitor. Remains in AF. Discharge plan unclear as to timing/sequence of any restoration of NSR, possible cath to further assess her valve, what GI work up is needed, etc. She is on Xarelto for her anticoagulation since January 5th.  She comes in today. She is here with her husband. She wishes that he does all the talking for her. She remains fatigued - has been for several months. No more shortness of breath. No chest pain. She has not had syncope. Very little in the way of palpitations. She continues to have daily alcohol use - 1 to 2 glasses of wine per day. No GI follow up noted. Off Lipitor. No bleeding or bruising noted.   Discussed benefits of [facility]  Risks including stroke , need for emergent pacing and intubation discussed Willing to proceed  Current Outpatient Prescriptions  Medication Sig Dispense Refill  . aspirin EC 81 MG tablet Take 81 mg by mouth daily.    . benzonatate (TESSALON) 100 MG capsule Take 1 capsule (100 mg total) by mouth 3  (three) times daily. For cough 30 capsule 0  . furosemide (LASIX) 20 MG tablet Take 1 tablet (20 mg total) by mouth daily. 90 tablet 3  . HYDROcodone-homatropine (HYCODAN) 5-1.5 MG/5ML syrup as needed.    . levETIRAcetam (KEPPRA) 750 MG tablet Take 750 mg by mouth 2 (two) times daily.    Marland Kitchen lisinopril (PRINIVIL,ZESTRIL) 2.5 MG tablet Take 1 tablet (2.5 mg total) by mouth daily. 90 tablet 3  . metoprolol tartrate (LOPRESSOR) 25 MG tablet Take 1 tablet (25 mg total) by mouth 2 (two) times daily. 60 tablet 1  . potassium chloride (K-DUR,KLOR-CON) 10 MEQ tablet Take 1 tablet (10 mEq total) by mouth daily. 180 tablet 3  . warfarin (COUMADIN) 5 MG tablet Take 1 tablet (5 mg total) by mouth daily. 90 tablet 3   No current facility-administered medications for this visit.    No Known Allergies  Past Medical History  Diagnosis Date  . Epilepsy, focal August 2012    Initially diagnosed as TIA   . Essential hypertension   . Hyperlipidemia   . PVD (peripheral vascular disease)     Saw Dr Albertine Patricia 2007 - right common Iliac artery stenosis  . Uterine cancer   . Breast CA     Bilateral  . Atrial fibrillation     Recently diagnosed  . Aortic stenosis     Mild 2014   .  Tachycardia   . CHF (congestive heart failure)   . Hepatic cirrhosis   . Heart murmur   . Colon polyps     Past Surgical History  Procedure Laterality Date  . Mastectomy Bilateral 2007  . Total abdominal hysterectomy  1990s  . Breast lumpectomy Bilateral     History  Smoking status  . Never Smoker   Smokeless tobacco  . Never Used    History  Alcohol Use  . Yes    Comment: 2 glasses of wine daily    Family History  Problem Relation Age of Onset  . Hypertension Mother   . Breast cancer Daughter     BRCA-1  . Heart attack Father 19  . Kidney failure Mother 30  . Heart failure Brother   . Diabetes Brother   . Kidney cancer Brother   . Seizures Brother   . Alzheimer's disease Brother   . Breast cancer Other      niece  . Hypertension Sister     Review of Systems: The review of systems is per the HPI.  All other systems were reviewed and are negative.  Physical Exam: There were no vitals taken for this visit.  BP 130/60 by me. Patient is alert and in no acute distress. Affect is pretty flat. Skin is warm and dry. Color is normal. Maybe slightly pale. HEENT is unremarkable. Normocephalic/atraumatic. PERRL. Sclera are nonicteric. Neck is supple. No masses. No JVD. Lungs are fairly clear. Cardiac exam shows an irregular rhythm. Rate ok. Harsh outflow murmur. Abdomen is soft. Extremities are without edema. Gait and ROM are intact. No gross neurologic deficits noted.  Wt Readings from Last 3 Encounters:  12/26/14 84.993 kg (187 lb 6 oz)  12/20/14 83.178 kg (183 lb 6 oz)  12/19/14 82.827 kg (182 lb 9.6 oz)    LABORATORY DATA/PROCEDURES: PENDING  Lab Results  Component Value Date   WBC 10.3 12/25/2014   HGB 15.3* 12/25/2014   HCT 47.9* 12/25/2014   PLT 281.0 12/25/2014   GLUCOSE 128* 12/25/2014   CHOL 277* 10/05/2014   TRIG 190.0* 10/05/2014   HDL 73.90 10/05/2014   LDLDIRECT 174.5 07/28/2013   LDLCALC 165* 10/05/2014   ALT 28 01/02/2015   AST 26 01/02/2015   NA 137 12/25/2014   K 4.1 12/25/2014   CL 106 12/25/2014   CREATININE 0.93 12/25/2014   BUN 15 12/25/2014   CO2 24 12/25/2014   TSH 1.72 12/12/2014   INR 2.2 01/08/2015    BNP (last 3 results) No results for input(s): PROBNP in the last 8760 hours.  Echo Study Conclusions from 12/2014  - Procedure narrative: Transthoracic echocardiography. Image quality was fair, with poor endocardial visualization. The study was technically difficult. - Left ventricle: The cavity size was normal. Wall thickness was increased in a pattern of mild LVH. Systolic function was normal. The estimated ejection fraction was in the range of 50% to 55%. Images were inadequate for LV wall motion assessment. The study was not  technically sufficient to allow evaluation of LV diastolic dysfunction due to atrial fibrillation. Doppler parameters are consistent with high ventricular filling pressure. - Aortic valve: Moderately calcified annulus. Moderately thickened, moderately calcified leaflets. Morphologically, there appears to be reduced cusp mobility with moderate to severe aortic stenosis. Peak velocities and mean gradients are likely underestimated. Mild eccentric regurgitation directed towards the anterior mitral leaflet. By planimetry, valve area is 0.83 cm squared. Peak velocity measured at 3.14 m/s. This is likely underestimated. Valve area (VTI): 1.24  cm^2. Valve area (Vmax): 1.19 cm^2. Valve area (Vmean): 1.19 cm^2. - Mitral valve: There was mild regurgitation. - Left atrium: The atrium was mildly to moderately dilated. - Right ventricle: The cavity size was normal. Wall thickness was normal. Systolic function was mildly reduced. - Right atrium: The atrium was moderately dilated. - Tricuspid valve: There was moderate regurgitation. - Pulmonary arteries: PA peak pressure: 47 mm Hg (S). Moderately elevated pulmonary pressures. - Systemic veins: IVC is dilated with normal respiratory variation. Estimated CVP 8 mmHg.   ECG:  12/15/14  afib rate 76 poor R wave progression 12/12/14  afib rate 118 poor R wave progression better rate control on most recent ECG    Ct Abdomen Pelvis W Wo Contrast  12/28/2014   CLINICAL DATA:  Evaluate for cirrhosis. History of breast cancer and bilateral mastectomy.  EXAM: CT ABDOMEN AND PELVIS WITHOUT AND WITH CONTRAST  TECHNIQUE: Multidetector CT imaging of the abdomen and pelvis was performed following the standard protocol before and following the bolus administration of intravenous contrast.  CONTRAST:  172m OMNIPAQUE IOHEXOL 300 MG/ML  SOLN  COMPARISON:  None.  FINDINGS: Lower chest: Enlargement of the right atrium and right ventricle identified.  There are bilateral pleural effusions right greater than left.  Hepatobiliary: Reflux of contrast material from the right atrium into the hepatic veins identified compatible with passive venous congestion. The contour of the liver is slightly irregular. No focal liver abnormality identified. No enhancing liver lesions identified to suggest hepatoma. The gallbladder appears normal. No biliary dilatation.  Pancreas: Normal appearance of the pancreas.  Spleen: The spleen is normal.  Adrenals/Urinary Tract: The adrenal glands are unremarkable. No focal kidney abnormality identified. The urinary bladder is unremarkable.  Stomach/Bowel: The stomach and the small bowel loops have a normal course and caliber. There is no evidence for bowel obstruction. Normal appearance of the colon.  Vascular/Lymphatic: Calcified atherosclerotic disease involves the abdominal aorta. No aneurysm. There is no upper abdominal adenopathy identified. No pelvic or inguinal adenopathy.  Reproductive: Previous hysterectomy.  No adnexal mass identified.  Other: Mild to moderate abdominal and pelvic ascites identified.  Musculoskeletal: Review of the visualized bony structures is negative for aggressive lytic or sclerotic bone lesion.  IMPRESSION: 1. 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 2. Suspect right heart failure. There is enlargement of the right atrium and right ventricle and reflux of contrast material into the hepatic veins consistent with passive venous congestion. Bilateral pleural effusions are present right greater than left.   Electronically Signed   By: TKerby MoorsM.D.   On: 12/28/2014 13:22   Dg Chest 2 View  12/20/2014   CLINICAL DATA:  Pleural effusion, atrial fibrillation, history of breast cancer  EXAM: CHEST  2 VIEW  COMPARISON:  12/15/2014  FINDINGS: Trace bilateral pleural effusions. Mild bilateral interstitial thickening. No focal consolidation or pneumothorax. Stable cardiomediastinal silhouette. Thoracic  aortic atherosclerosis. Surgical clips in bilateral axilla.  The osseous structures are unremarkable.  IMPRESSION: Trace bilateral pleural effusions and mild interstitial edema.   Electronically Signed   By: HKathreen Devoid  On: 12/20/2014 14:51   Dg Chest 2 View  12/15/2014   CLINICAL DATA:  80year old female with increasing shortness of breath for several days. Cough. Initial encounter.  EXAM: CHEST  2 VIEW  COMPARISON:  12/14/2014 and earlier.  FINDINGS: Continued opacity at both lung bases suggesting pleural effusions. No interval progression. Stable lung volumes. No pneumothorax, pulmonary edema, or new pulmonary opacity. Stable cardiac size and  mediastinal contours. Postoperative changes to the bilateral axilla and chest wall. No acute osseous abnormality identified.  IMPRESSION: Stable bilateral pleural effusions. No new cardiopulmonary abnormality.   Electronically Signed   By: Lars Pinks M.D.   On: 12/15/2014 16:20   Dg Chest 2 View  12/14/2014   CLINICAL DATA:  Cough for 2 weeks.  EXAM: CHEST  2 VIEW  COMPARISON:  PA and lateral chest 12/12/2014. Single view of the chest 04/08/2006.  FINDINGS: The patient is status post bilateral mastectomy and axillary dissection. There are small bilateral pleural effusions and basilar atelectasis. Small effusions appear slightly increased since the prior examination. Heart size is upper normal but there is no pulmonary edema. No pneumothorax is identified.  IMPRESSION: Small bilateral pleural effusions and basilar atelectasis so slight increase since the study 2 days ago.  Cardiomegaly without edema.   Electronically Signed   By: Inge Rise M.D.   On: 12/14/2014 11:12   Dg Chest 2 View  12/12/2014   CLINICAL DATA:  Persistent cough, new onset atrial fibrillation, history hypertension, breast cancer post BILATERAL mastectomy, uterine cancer  EXAM: CHEST  2 VIEW  COMPARISON:  04/08/2006  FINDINGS: Mild enlargement of cardiac silhouette.  Atherosclerotic  calcification aorta.  Mediastinal contours and pulmonary vascularity normal.  Prior BILATERAL breast reconstructions.  Bibasilar atelectasis versus scarring.  No acute infiltrate, definite pleural effusion or pneumothorax.  Bones demineralized.  IMPRESSION: Enlargement of cardiac silhouette.  Bibasilar atelectasis versus scarring.   Electronically Signed   By: Lavonia Dana M.D.   On: 12/12/2014 18:20   US Abdomen Complete  12/15/2014   CLINICAL DATA:  80 year old female with shortness breath, fatigue, cough for the past 2-4 weeks, and elevated liver function tests. Elevated white blood cell count.  EXAM: ULTRASOUND ABDOMEN COMPLETE  COMPARISON:  No priors.  FINDINGS: Gallbladder: 2 small echogenic foci with posterior acoustic shadowing in the neck of the gallbladder, compatible with small gallstones. Gallbladder appears only mildly distended. Gallbladder wall appears slightly edematous, measuring up to 5 mm in thickness. No pericholecystic fluid. Per report from the sonographer, the patient did not exhibit a sonographic Murphy's sign on examination.  Common bile duct: Diameter: 2.9 mm in the porta hepatis.  Liver: Slight nodular contour of the liver. No focal lesion identified. Within normal limits in parenchymal echogenicity. Bidirectional flow noted in the portal vein.  IVC: No abnormality visualized.  Pancreas: Visualized portion unremarkable.  Spleen: Size and appearance within normal limits.  5.4 cm in length.  Right Kidney: Length: 11.3 cm. Echogenicity within normal limits. 1.0 x 1.0 x 0.9 cm hypoechoic lesion with increased through transmission in the upper pole, likely a tiny cyst. No hydronephrosis visualized.  Left Kidney: Length: 11.1 cm. Echogenicity within normal limits. No mass or hydronephrosis visualized.  Abdominal aorta: No aneurysm visualized.  Other findings: Bilateral pleural effusions.  IMPRESSION: 1. Slightly nodular contour of the liver with bidirectional flow on the portal vein. These  findings may indicate underlying cirrhosis. 2. There are 2 small gallstones in the neck of the gallbladder. No definitive findings to suggest acute cholecystitis at this time. Mild gallbladder wall thickening may in part relate to under distention of the gallbladder, and could also be related to intrinsic hepatic disease. 3. Bilateral pleural effusions. 4. Small hypoechoic lesion in the upper pole of the right kidney is likely a small cyst.   Electronically Signed   By: Vinnie Langton M.D.   On: 12/15/2014 18:04    Assessment / Plan: 1.  New onset AF - due to abnormal LFT's changed over to coumadin  INR Rx last 4 weeks    Have called hospital and scheduled [facility] for 2/11  Will have repeat lab work On 2/10  Orders written   2. Progressive AS - reviewed by Dr. Angelena Form. Mean gradient 19. Felt to be moderate - will continue to follow for now. She may end up being a TAVR candidate in  Future given her comorbidities   3. Abnormal LFTs - cirrhosis seen by GI  Labs improved last checked w/u negative so far  Temporally related to xarelto      [employer]

## 2015-01-18 NOTE — Anesthesia Procedure Notes (Signed)
Procedure Name: MAC Date/Time: 01/18/2015 10:56 AM Performed by: Carola Frost Pre-anesthesia Checklist: Patient identified, Timeout performed, Emergency Drugs available, Suction available and Patient being monitored Patient Re-evaluated:Patient Re-evaluated prior to inductionOxygen Delivery Method: Ambu bag Preoxygenation: Pre-oxygenation with 100% oxygen Intubation Type: IV induction Placement Confirmation: breath sounds checked- equal and bilateral Dental Injury: Teeth and Oropharynx as per pre-operative assessment

## 2015-01-19 ENCOUNTER — Encounter (HOSPITAL_COMMUNITY): Payer: Self-pay | Admitting: Cardiovascular Disease

## 2015-01-23 ENCOUNTER — Encounter: Payer: Self-pay | Admitting: Cardiovascular Disease

## 2015-01-23 ENCOUNTER — Ambulatory Visit (INDEPENDENT_AMBULATORY_CARE_PROVIDER_SITE_OTHER): Payer: Medicare Other | Admitting: *Deleted

## 2015-01-23 DIAGNOSIS — Z5181 Encounter for therapeutic drug level monitoring: Secondary | ICD-10-CM

## 2015-01-23 DIAGNOSIS — I481 Persistent atrial fibrillation: Secondary | ICD-10-CM

## 2015-01-23 DIAGNOSIS — I4819 Other persistent atrial fibrillation: Secondary | ICD-10-CM

## 2015-01-23 LAB — POCT INR: INR: 3.7

## 2015-01-24 NOTE — Telephone Encounter (Signed)
Spoke with pt husband Mr. Milich.  Stated pt started to have SOB with shallow breath at rest since Sunday 2/14.  Pt has been taking medications as scheduled. Pt has no complaints of chest pain, palpitations, dizziness, or lightheadedness. Pt morning BP 96/72 left arm and HR 57 and right arm BP 117/93 and HR 104. Pt educated to check BP and HR at least daily but best if morning and evening. If notices HR starts to increase and consistently stay at or above 120 at rest to call and/or having symptoms to our office or 911, if feels appropriate. Pt has schedule follow up appointment on 3/1 with Dr. Johnsie Cancel and strongly encouraged to attend this appointment.

## 2015-01-25 ENCOUNTER — Telehealth: Payer: Self-pay | Admitting: *Deleted

## 2015-01-25 NOTE — Telephone Encounter (Signed)
PER  DR NISHAN  PT NEEDS   APPT WITH [facility]   PA  ON Friday OR Monday  WILL SEND  MESSAGE TO SCHEDULER TO  SET UP PER PT'S HUSBAND WOULD LIKE  APPT TOM  .Adonis Housekeeper

## 2015-01-25 NOTE — Telephone Encounter (Signed)
PT HAS  APPT WITH   DAYNA DUNN PA  TOMORROW ( 01-26-15 )   AT    9:30 AM .Adonis Housekeeper

## 2015-01-26 ENCOUNTER — Encounter: Payer: Self-pay | Admitting: Physician Assistant

## 2015-01-26 ENCOUNTER — Ambulatory Visit (INDEPENDENT_AMBULATORY_CARE_PROVIDER_SITE_OTHER): Payer: Medicare Other | Admitting: Physician Assistant

## 2015-01-26 VITALS — BP 145/87 | HR 73 | Resp 18 | Wt 176.4 lb

## 2015-01-26 DIAGNOSIS — I481 Persistent atrial fibrillation: Secondary | ICD-10-CM

## 2015-01-26 DIAGNOSIS — I350 Nonrheumatic aortic (valve) stenosis: Secondary | ICD-10-CM

## 2015-01-26 DIAGNOSIS — I10 Essential (primary) hypertension: Secondary | ICD-10-CM

## 2015-01-26 DIAGNOSIS — I5032 Chronic diastolic (congestive) heart failure: Secondary | ICD-10-CM

## 2015-01-26 DIAGNOSIS — R739 Hyperglycemia, unspecified: Secondary | ICD-10-CM

## 2015-01-26 DIAGNOSIS — K7460 Unspecified cirrhosis of liver: Secondary | ICD-10-CM

## 2015-01-26 DIAGNOSIS — I4819 Other persistent atrial fibrillation: Secondary | ICD-10-CM

## 2015-01-26 NOTE — Patient Instructions (Addendum)
Your physician recommends that you continue on your current medications as directed. Please refer to the Current Medication list given to you today.  Your physician recommends that you come to the office on Tuesday 01/30/2015 at 9:30 am for appointment with Elberta Leatherwood for [place]. Plan to be in the hospital that evening for a three night stay.

## 2015-01-26 NOTE — Progress Notes (Signed)
Cardiology Office Note Date:  01/26/2015  Patient ID:  Katie, Pierce [birth year], MRN [id number] PCP:  Kathlene November, MD  Cardiologist: Johnsie Cancel  Chief Complaint: follow-up of atrial fibrillation and recent cardioversion; fatigue  History of Present Illness: [name] is a 80 y.o. female with history of  recently diagnosed AF and probable concomitant diastolic CHF, HTN, HLD, PVD with right common iliac stenosis in 2007, prior breast/uterine CA, seizure disorder who presents for follow-up of recent DCCV.   She was admitted 12/2014 with several months of feeling poorly with fatigue, cough, and dyspnea. She was found to have bilateral effusions and CHF felt due to newly recognized atrial fibrillation. 2D Echo 12/16/14: mild LVH, EF 50-55%, high ventricular filling pressure, unable to tell DD due to AF, moderate-severe AS, mild MR, mild-mod LAE, mildly reduced RV function, mod RAE, mod TR, PASP 35mHg. She was placed on Xarelto and later changed to Coumadin due to abnormal liver function. She was also seen by GI for abnormal LFTs/cirrhosis, question secondary to prior chemo or alcohol; statin discontinued. LFTs improved by last check in January with residual alk phos elevation of 131. TSH wnl 12/2014. She underwent outpatient DCCV 01/18/15 with successful restoration of NSR. Dr. NJohnsie Cancelplans to follow her AS for now and feels she may end up being a TAVR candidate in the future given her comorbidities.  She returns today for follow-up due to recurrence of significant fatigue. She is back in atrial fibrillation. Her husband reports that she has h/o hoarseness and mild memory issues - he says both of these seemed to improve immediately post-cardioversion. She felt much better in NSR. However, 4 days after DCCV, she developed recurrence of the above, along with fatigue. The patient denies any CP or SOB but her husband has noticed her breathing more shallow on occasion. No recent CP or claudication.  Past  Medical History  Diagnosis Date  . Epilepsy, focal August 2012    Initially diagnosed as TIA   . Essential hypertension   . Hyperlipidemia   . PVD (peripheral vascular disease)     Saw Dr DAlbertine Patricia2007 - right common Iliac artery stenosis  . Uterine cancer   . Breast CA     Bilateral  . Atrial fibrillation     a. Dx 12/2014, s/p DCCV 01/2015. Xarelto changed to Coumadin due to elevated LFTs.  . Aortic stenosis     a. Mod-severe by echo 12/2014.  . Tachycardia   . Diastolic CHF     a. In setting of AF.   .Marland KitchenHepatic cirrhosis   . Colon polyps   . Elevated LFTs   . Pulmonary hypertension     a. Mod by echo 12/2014.    Past Surgical History  Procedure Laterality Date  . Mastectomy Bilateral 2007  . Total abdominal hysterectomy  1990s  . Breast lumpectomy Bilateral   . Cardioversion N/A 01/18/2015    Procedure: CARDIOVERSION;  Surgeon: PJosue Hector MD;  [facility];  Service: Cardiovascular;  Laterality: N/A;    Current Outpatient Prescriptions  Medication Sig Dispense Refill  . furosemide (LASIX) 20 MG tablet Take 1 tablet (20 mg total) by mouth daily. 90 tablet 3  . levETIRAcetam (KEPPRA) 750 MG tablet Take 750 mg by mouth 2 (two) times daily.    .Marland Kitchenlisinopril (PRINIVIL,ZESTRIL) 2.5 MG tablet Take 1 tablet (2.5 mg total) by mouth daily. 90 tablet 3  . metoprolol tartrate (LOPRESSOR) 25 MG tablet Take 1 tablet (25 mg  total) by mouth 2 (two) times daily. 60 tablet 1  . potassium chloride (K-DUR,KLOR-CON) 10 MEQ tablet Take 1 tablet (10 mEq total) by mouth daily. (Patient taking differently: Take 10 mEq by mouth 2 (two) times daily. ) 180 tablet 3  . warfarin (COUMADIN) 5 MG tablet Take 1 tablet (5 mg total) by mouth daily. (Patient taking differently: Take 5 mg by mouth daily. 34m on Mondays and Fridays, 2.534mall other days) 90 tablet 3   No current facility-administered medications for this visit.    Allergies:   Review of patient's allergies indicates no known  allergies.   Social History:  The patient  reports that she has never smoked. She has never used smokeless tobacco. She reports that she drinks alcohol. She reports that she does not use illicit drugs.   Family History:  The patient's family history includes Alzheimer's disease in her brother; Breast cancer in her daughter and other; Diabetes in her brother; Heart attack (age of onset: 6119in her father; Heart failure in her brother; Hypertension in her mother and sister; Kidney cancer in her brother; Kidney failure (age of onset: 6137in her mother; Seizures in her brother.  ROS:  Please see the history of present illness.  All other systems are reviewed and otherwise negative.   PHYSICAL EXAM:  VS:  BP 145/87 mmHg  Pulse 73  Resp 18  Wt 176 lb 6.4 oz (80.015 kg)  SpO2 97% BMI: Body mass index is 27.62 kg/(m^2). Well nourished, well developed WF, in no acute distress HEENT: normocephalic, atraumatic Neck: no JVD Cardiac:  normal S1, S2; irregularly irregular, 2/6 systolic ejection murmur RUSB Lungs:  diminished BS right lung base, otherwise no wheezing, rhonchi or rales Abd: soft, nontender, no hepatomegaly Ext: trace sockline edema Skin: warm and dry Neuro:  moves all extremities spontaneously, no focal abnormalities noted  EKG:  Atrial fibrillation 73bpm, LAFB, nonspecific ST-T changes  Recent Labs: 12/12/2014: Magnesium 2.2; TSH 1.72 12/16/2014: B Natriuretic Peptide 378.3* 01/02/2015: ALT 28 01/15/2015: BUN 15; Creatinine 1.08; Hemoglobin 15.5*; Platelets 236.0; Potassium 3.9; Sodium 139  10/05/2014: Cholesterol, Total 277*; HDL Cholesterol by NMR 73.90; LDL (calc) 165*; Total CHOL/HDL Ratio 4; Triglycerides 190.0*; VLDL 38.0   Estimated Creatinine Clearance: 47.5 mL/min (by C-G formula based on Cr of 1.08).   Wt Readings from Last 3 Encounters:  01/26/15 176 lb 6.4 oz (80.015 kg)  01/11/15 185 lb 1.9 oz (83.97 kg)  12/26/14 187 lb 6 oz (84.993 kg)     Other studies  reviewed: Additional studies/records reviewed today include: summarized above. Recent blood sugars 122-128.  ASSESSMENT AND PLAN:  1. Persistent atrial fibrillation s/p DCCV - discussed rate and rhythm control with the patient and her husband. They are both clear that she felt much better in NSR. Rate is controlled but she remains with fatigue. I discussed with Dr. NiJohnsie Cancelnd Dr. AlRayann HemanPer discussion with Dr. AlRayann Hemanshe is not a good candidate for amiodarone or Multaq due to hepatic disease and not a good candidate for flecainide with age/possible conduction disease (LAFB). The best options are sotalol and Tikosyn, with Dr. AlRayann Hemanavoring efficacy of Tikosyn. I offered both to the patient, explaining that sotalol is less expensive but Tikosyn may be more effective. They have elected to begin Tikosyn. Risks, benefits, alternatives discussed in detail with the patient and her husband who would like to proceed with Tikosyn initiation. I discussed our protocol for initiation including 3-day hospital stay. Baseline QTc [id number] clinic today,  handcalculated by me. We have set her up with an appointment in clinic with Elberta Leatherwood, pharmD to facilitate baseline labs and admission to the hospital for initiation. This will take place on 01/30/15. Continue Coumadin. 2. Chronic diastolic CHF - appears euvolemic except mildly decreased BS right lung base. Suspect this will improve with maintenance of NSR. No cough or infective sx. 3. Essential hypertension - has not taken AM meds yet. Patient reports BPs at home running 1-teens. Will not make any changes today. 4. Aortic stenosis - d/w Dr. Johnsie Cancel. At this time will hold off on further eval or intervention pending pursuit of NSR. WIth comorbidities may eventually need consideration of TAVR. 5. Hepatic cirrhosis - she continues to follow with GI for this and has upcoming labs for them on Monday 01/29/15. 6. Hyperglycemia by recent labs - advised to f/u  PCP.  Disposition: F/u with Elberta Leatherwood, pharmD as above.  Current medicines are reviewed at length with the patient today.  The patient did not have any concerns regarding medicines.  Raechel Ache PA-C 01/26/2015 11:07 AM     [facility] [address] [place] [id number] [phone] (office)  [phone] (fax)

## 2015-01-29 ENCOUNTER — Encounter: Payer: Self-pay | Admitting: Pharmacist

## 2015-01-29 ENCOUNTER — Other Ambulatory Visit (INDEPENDENT_AMBULATORY_CARE_PROVIDER_SITE_OTHER): Payer: Medicare Other

## 2015-01-29 ENCOUNTER — Encounter: Payer: Self-pay | Admitting: Physician Assistant

## 2015-01-29 DIAGNOSIS — K7460 Unspecified cirrhosis of liver: Secondary | ICD-10-CM

## 2015-01-29 DIAGNOSIS — R945 Abnormal results of liver function studies: Secondary | ICD-10-CM

## 2015-01-29 DIAGNOSIS — R7989 Other specified abnormal findings of blood chemistry: Secondary | ICD-10-CM

## 2015-01-29 LAB — HEPATIC FUNCTION PANEL
ALBUMIN: 3.8 g/dL (ref 3.5–5.2)
ALT: 21 U/L (ref 0–35)
AST: 26 U/L (ref 0–37)
Alkaline Phosphatase: 98 U/L (ref 39–117)
BILIRUBIN DIRECT: 0.4 mg/dL — AB (ref 0.0–0.3)
TOTAL PROTEIN: 6.8 g/dL (ref 6.0–8.3)
Total Bilirubin: 0.9 mg/dL (ref 0.2–1.2)

## 2015-01-30 ENCOUNTER — Encounter: Payer: Self-pay | Admitting: Physician Assistant

## 2015-01-30 ENCOUNTER — Ambulatory Visit: Payer: Medicare Other | Admitting: Pharmacist

## 2015-01-31 ENCOUNTER — Encounter: Payer: Self-pay | Admitting: Physician Assistant

## 2015-01-31 ENCOUNTER — Encounter: Payer: Self-pay | Admitting: Pharmacist

## 2015-02-01 ENCOUNTER — Encounter: Payer: Self-pay | Admitting: Physician Assistant

## 2015-02-02 ENCOUNTER — Emergency Department (HOSPITAL_COMMUNITY): Payer: Medicare Other

## 2015-02-02 ENCOUNTER — Telehealth: Payer: Self-pay | Admitting: Cardiovascular Disease

## 2015-02-02 ENCOUNTER — Encounter: Payer: Self-pay | Admitting: Cardiology

## 2015-02-02 ENCOUNTER — Observation Stay (HOSPITAL_COMMUNITY)
Admission: EM | Admit: 2015-02-02 | Discharge: 2015-02-03 | Disposition: A | Discharge status: Home / Self Care | Payer: Medicare Other | Attending: Family Medicine | Admitting: Family Medicine

## 2015-02-02 ENCOUNTER — Encounter (HOSPITAL_COMMUNITY): Payer: Self-pay | Admitting: Emergency Medicine

## 2015-02-02 ENCOUNTER — Observation Stay (HOSPITAL_COMMUNITY): Payer: Medicare Other

## 2015-02-02 ENCOUNTER — Ambulatory Visit (INDEPENDENT_AMBULATORY_CARE_PROVIDER_SITE_OTHER): Payer: Medicare Other | Admitting: Cardiology

## 2015-02-02 VITALS — BP 142/86 | HR 70 | Ht 67.0 in | Wt 174.8 lb

## 2015-02-02 DIAGNOSIS — Z853 Personal history of malignant neoplasm of breast: Secondary | ICD-10-CM | POA: Insufficient documentation

## 2015-02-02 DIAGNOSIS — I5032 Chronic diastolic (congestive) heart failure: Secondary | ICD-10-CM | POA: Insufficient documentation

## 2015-02-02 DIAGNOSIS — N390 Urinary tract infection, site not specified: Secondary | ICD-10-CM

## 2015-02-02 DIAGNOSIS — I10 Essential (primary) hypertension: Secondary | ICD-10-CM | POA: Insufficient documentation

## 2015-02-02 DIAGNOSIS — I350 Nonrheumatic aortic (valve) stenosis: Secondary | ICD-10-CM | POA: Insufficient documentation

## 2015-02-02 DIAGNOSIS — R4182 Altered mental status, unspecified: Secondary | ICD-10-CM | POA: Diagnosis not present

## 2015-02-02 DIAGNOSIS — Z79899 Other long term (current) drug therapy: Secondary | ICD-10-CM | POA: Diagnosis not present

## 2015-02-02 DIAGNOSIS — K5790 Diverticulosis of intestine, part unspecified, without perforation or abscess without bleeding: Secondary | ICD-10-CM | POA: Diagnosis not present

## 2015-02-02 DIAGNOSIS — Z7901 Long term (current) use of anticoagulants: Secondary | ICD-10-CM | POA: Insufficient documentation

## 2015-02-02 DIAGNOSIS — I272 Other secondary pulmonary hypertension: Secondary | ICD-10-CM | POA: Insufficient documentation

## 2015-02-02 DIAGNOSIS — I481 Persistent atrial fibrillation: Secondary | ICD-10-CM

## 2015-02-02 DIAGNOSIS — K7460 Unspecified cirrhosis of liver: Secondary | ICD-10-CM | POA: Diagnosis not present

## 2015-02-02 DIAGNOSIS — I4819 Other persistent atrial fibrillation: Secondary | ICD-10-CM | POA: Diagnosis present

## 2015-02-02 DIAGNOSIS — I4891 Unspecified atrial fibrillation: Secondary | ICD-10-CM | POA: Diagnosis not present

## 2015-02-02 DIAGNOSIS — E785 Hyperlipidemia, unspecified: Secondary | ICD-10-CM | POA: Insufficient documentation

## 2015-02-02 DIAGNOSIS — R569 Unspecified convulsions: Secondary | ICD-10-CM

## 2015-02-02 DIAGNOSIS — I739 Peripheral vascular disease, unspecified: Secondary | ICD-10-CM | POA: Diagnosis not present

## 2015-02-02 DIAGNOSIS — G40109 Localization-related (focal) (partial) symptomatic epilepsy and epileptic syndromes with simple partial seizures, not intractable, without status epilepticus: Secondary | ICD-10-CM | POA: Insufficient documentation

## 2015-02-02 DIAGNOSIS — J189 Pneumonia, unspecified organism: Secondary | ICD-10-CM

## 2015-02-02 DIAGNOSIS — R410 Disorientation, unspecified: Secondary | ICD-10-CM

## 2015-02-02 DIAGNOSIS — Z8542 Personal history of malignant neoplasm of other parts of uterus: Secondary | ICD-10-CM | POA: Diagnosis not present

## 2015-02-02 LAB — URINALYSIS, ROUTINE W REFLEX MICROSCOPIC
BILIRUBIN URINE: NEGATIVE
Glucose, UA: NEGATIVE mg/dL
Ketones, ur: NEGATIVE mg/dL
Nitrite: NEGATIVE
PH: 5 (ref 5.0–8.0)
Protein, ur: NEGATIVE mg/dL
Specific Gravity, Urine: 1.016 (ref 1.005–1.030)
Urobilinogen, UA: 0.2 mg/dL (ref 0.0–1.0)

## 2015-02-02 LAB — COMPREHENSIVE METABOLIC PANEL
ALBUMIN: 4.1 g/dL (ref 3.5–5.2)
ALT: 35 U/L (ref 0–35)
ANION GAP: 11 (ref 5–15)
AST: 46 U/L — ABNORMAL HIGH (ref 0–37)
Alkaline Phosphatase: 124 U/L — ABNORMAL HIGH (ref 39–117)
BUN: 9 mg/dL (ref 6–23)
CHLORIDE: 105 mmol/L (ref 96–112)
CO2: 23 mmol/L (ref 19–32)
Calcium: 9.4 mg/dL (ref 8.4–10.5)
Creatinine, Ser: 1 mg/dL (ref 0.50–1.10)
GFR calc non Af Amer: 53 mL/min — ABNORMAL LOW (ref >90)
GFR, EST AFRICAN AMERICAN: 61 mL/min — AB (ref >90)
Glucose, Bld: 121 mg/dL — ABNORMAL HIGH (ref 70–99)
POTASSIUM: 4.1 mmol/L (ref 3.5–5.1)
Sodium: 139 mmol/L (ref 135–145)
Total Bilirubin: 1.3 mg/dL — ABNORMAL HIGH (ref 0.3–1.2)
Total Protein: 7.6 g/dL (ref 6.0–8.3)

## 2015-02-02 LAB — CBC WITH DIFFERENTIAL/PLATELET
BASOS ABS: 0 10*3/uL (ref 0.0–0.1)
Basophils Relative: 0 % (ref 0–1)
EOS PCT: 0 % (ref 0–5)
Eosinophils Absolute: 0 10*3/uL (ref 0.0–0.7)
HEMATOCRIT: 46.9 % — AB (ref 36.0–46.0)
Hemoglobin: 15.3 g/dL — ABNORMAL HIGH (ref 12.0–15.0)
LYMPHS ABS: 0.9 10*3/uL (ref 0.7–4.0)
LYMPHS PCT: 9 % — AB (ref 12–46)
MCH: 27.6 pg (ref 26.0–34.0)
MCHC: 32.6 g/dL (ref 30.0–36.0)
MCV: 84.5 fL (ref 78.0–100.0)
Monocytes Absolute: 0.8 10*3/uL (ref 0.1–1.0)
Monocytes Relative: 8 % (ref 3–12)
NEUTROS ABS: 8.1 10*3/uL — AB (ref 1.7–7.7)
Neutrophils Relative %: 83 % — ABNORMAL HIGH (ref 43–77)
Platelets: 226 10*3/uL (ref 150–400)
RBC: 5.55 MIL/uL — AB (ref 3.87–5.11)
RDW: 14.5 % (ref 11.5–15.5)
WBC: 9.8 10*3/uL (ref 4.0–10.5)

## 2015-02-02 LAB — I-STAT CG4 LACTIC ACID, ED: Lactic Acid, Venous: 2.48 mmol/L (ref 0.5–2.0)

## 2015-02-02 LAB — PROTIME-INR
INR: 1.73 — ABNORMAL HIGH (ref 0.00–1.49)
Prothrombin Time: 20.4 seconds — ABNORMAL HIGH (ref 11.6–15.2)

## 2015-02-02 LAB — URINE MICROSCOPIC-ADD ON

## 2015-02-02 LAB — I-STAT TROPONIN, ED: Troponin i, poc: 0.01 ng/mL (ref 0.00–0.08)

## 2015-02-02 LAB — CBG MONITORING, ED: Glucose-Capillary: 131 mg/dL — ABNORMAL HIGH (ref 70–99)

## 2015-02-02 LAB — BRAIN NATRIURETIC PEPTIDE: B Natriuretic Peptide: 412.4 pg/mL — ABNORMAL HIGH (ref 0.0–100.0)

## 2015-02-02 LAB — TROPONIN I: Troponin I: <0.03 ng/mL (ref <0.031)

## 2015-02-02 MED ORDER — LISINOPRIL 2.5 MG PO TABS
2.5000 mg | ORAL_TABLET | Freq: Every day | ORAL | Status: DC
  Administered 2015-02-03: 2.5 mg via ORAL
  Filled 2015-02-02: qty 1

## 2015-02-02 MED ORDER — AZITHROMYCIN 500 MG PO TABS
500.0000 mg | ORAL_TABLET | ORAL | Status: DC
  Administered 2015-02-03: 500 mg via ORAL
  Filled 2015-02-02 (×2): qty 1

## 2015-02-02 MED ORDER — WARFARIN - PHARMACIST DOSING INPATIENT: Freq: Every day | Status: DC

## 2015-02-02 MED ORDER — SODIUM CHLORIDE 0.9 % IJ SOLN: 3.0000 mL | INTRAMUSCULAR | Status: DC | PRN

## 2015-02-02 MED ORDER — CEFTRIAXONE SODIUM IN DEXTROSE 20 MG/ML IV SOLN
1.0000 g | INTRAVENOUS | Status: DC
  Administered 2015-02-03: 1 g via INTRAVENOUS
  Filled 2015-02-02 (×2): qty 50

## 2015-02-02 MED ORDER — FUROSEMIDE 20 MG PO TABS
20.0000 mg | ORAL_TABLET | Freq: Every day | ORAL | Status: DC
  Administered 2015-02-03: 20 mg via ORAL
  Filled 2015-02-02: qty 1

## 2015-02-02 MED ORDER — STROKE: EARLY STAGES OF RECOVERY BOOK
Freq: Once | Status: AC
  Administered 2015-02-03: 01:00:00

## 2015-02-02 MED ORDER — SODIUM CHLORIDE 0.9 % IJ SOLN
3.0000 mL | Freq: Two times a day (BID) | INTRAMUSCULAR | Status: DC
  Administered 2015-02-03 (×2): 3 mL via INTRAVENOUS

## 2015-02-02 MED ORDER — LEVETIRACETAM 750 MG PO TABS
750.0000 mg | ORAL_TABLET | Freq: Two times a day (BID) | ORAL | Status: DC
  Administered 2015-02-03: 750 mg via ORAL
  Filled 2015-02-02: qty 1

## 2015-02-02 MED ORDER — LEVOFLOXACIN IN D5W 750 MG/150ML IV SOLN
750.0000 mg | Freq: Once | INTRAVENOUS | Status: AC
  Administered 2015-02-02: 750 mg via INTRAVENOUS
  Filled 2015-02-02: qty 150

## 2015-02-02 MED ORDER — WARFARIN SODIUM 5 MG PO TABS
5.0000 mg | ORAL_TABLET | Freq: Once | ORAL | Status: AC
  Administered 2015-02-03: 5 mg via ORAL
  Filled 2015-02-02: qty 1

## 2015-02-02 MED ORDER — METOPROLOL TARTRATE 25 MG PO TABS
25.0000 mg | ORAL_TABLET | Freq: Two times a day (BID) | ORAL | Status: DC
  Administered 2015-02-03 (×2): 25 mg via ORAL
  Filled 2015-02-02 (×2): qty 1

## 2015-02-02 NOTE — Assessment & Plan Note (Addendum)
Unclear Etiology

## 2015-02-02 NOTE — Assessment & Plan Note (Signed)
Coumadin Rx 

## 2015-02-02 NOTE — ED Notes (Signed)
Pt here for eval of possible AMS earlier today per family; pt was not responding accordingly; pt alert and oriented at present with only complaint of fatigue; pt mae and in no distress; pt sts hx of afib in past

## 2015-02-02 NOTE — ED Notes (Signed)
Report attempted 

## 2015-02-02 NOTE — Assessment & Plan Note (Signed)
07/20/2011 Partial Complex Seizures with presentation as "absence"phenomena Dr Jacelyn Grip Rxed Keppra 09/2011

## 2015-02-02 NOTE — Telephone Encounter (Signed)
New message      Pt c/o BP issue: STAT if pt c/o blurred vision, one-sided weakness or slurred speech  1. What are your last 5 BP readings? 137/108 HR 70 2. Are you having any other symptoms (ex. Dizziness, headache, blurred vision, passed out)?  Fatigue-- 3. What is your BP issue? Pt is high and husband thinks she may need to go to the ER

## 2015-02-02 NOTE — Progress Notes (Signed)
02/02/2015 [name]   01-May-1935  [id number]  Primary Physician Katie November, MD Primary Cardiologist: [name]  HPI:  80 y.o. female with history of AF with concomitant diastolic CHF, HTN, HLD, PVD with right common iliac stenosis in 2007, prior breast/uterine CA, and  Partial complex seizure disorder in 2012  Who was seen Jan 2016 with several months of feeling poorly with fatigue, cough, and dyspnea. She was found to have bilateral effusions and CHF felt due to newly recognized atrial fibrillation. 2D Echo 12/16/14: mild LVH, EF 50-55%, high ventricular filling pressure, unable to tell DD due to AF, moderate-severe AS, mild MR, mild-mod LAE, mildly reduced RV function, mod RAE, mod TR, PASP 66mmHg. She was placed on Xarelto and later changed to Coumadin due to abnormal liver function. She was also seen by GI for abnormal LFTs/cirrhosis, question secondary to prior chemo or alcohol; statin discontinued. LFTs improved by last check in January with residual alk phos elevation of 131. TSH wnl 12/2014. She underwent outpatient DCCV 01/18/15 with successful restoration of NSR but was found to be back in AF on a f/u OV 01/26/15.  Treatment options were discussed with [name] and it was decided to plan for admission for Tikosyn but her insurance company declined this. She was going to discuss further options (Sotalol) with [name].          The pt is seen now in the [facility] after her husband says she had an unresponsive episode this am. He says they woke up, had breakfast, and were watching TV when he looked over and her head was back with her mouth open. She was not initially responsive to stimulation and he became concerned. She finally did "wake up" but she was confused. He says he took her B/P and it was 132/100. She was not diaphoretic.There was no tonic clonic movement or loss of bladder or bowel control. The husband says this event was unlike her previous seizure. In the office this  afternoon she is awake and oriented but can't give me details of what happened. She denies any OTC medication use or sedative use.    Current Outpatient Prescriptions  Medication Sig Dispense Refill  . furosemide (LASIX) 20 MG tablet Take 1 tablet (20 mg total) by mouth daily. 90 tablet 3  . levETIRAcetam (KEPPRA) 750 MG tablet Take 750 mg by mouth 2 (two) times daily.    Marland Kitchen lisinopril (PRINIVIL,ZESTRIL) 2.5 MG tablet Take 1 tablet (2.5 mg total) by mouth daily. 90 tablet 3  . metoprolol tartrate (LOPRESSOR) 25 MG tablet Take 1 tablet (25 mg total) by mouth 2 (two) times daily. 60 tablet 1  . potassium chloride (K-DUR) 10 MEQ tablet Take 10 mEq by mouth 2 (two) times daily. Patient taking two tablets by 20 meq by mouth daily. One one 10 meq in the morning and one 10 meq at lunch time    . warfarin (COUMADIN) 5 MG tablet Take 1 tablet (5 mg total) by mouth daily. (Patient taking differently: Take 5 mg by mouth daily. $RemoveBefo'5mg'ltrmOxBzqHQ$  on Mondays and Fridays, 2.$RemoveBefore'5mg'fqLefZWkMEuOB$  all other days) 90 tablet 3   No current facility-administered medications for this visit.    No Known Allergies  History   Social History  . Marital Status: Married    Spouse Name: N/A  . Number of Children: 4  . Years of Education: N/A   Occupational History  . retired    Social History Main Topics  .  Smoking status: Never Smoker   . Smokeless tobacco: Never Used  . Alcohol Use: Yes     Comment: 2 glasses of wine daily  . Drug Use: No  . Sexual Activity: Not Currently   Other Topics Concern  . Not on file   Social History Narrative     Review of Systems: General: negative for chills, fever, night sweats or weight changes.  Cardiovascular: negative for chest pain, dyspnea on exertion, edema, orthopnea, palpitations, paroxysmal nocturnal dyspnea or shortness of breath Dermatological: negative for rash Respiratory: negative for cough or wheezing Urologic: negative for hematuria Abdominal: negative for nausea, vomiting,  diarrhea, bright red blood per rectum, melena, or hematemesis Neurologic: negative for visual changes, syncope, or dizziness All other systems reviewed and are otherwise negative except as noted above.    Blood pressure 142/86, pulse 70, height $RemoveBe'5\' 7"'DOCVSnkEH$  (1.702 m), weight 174 lb 12.8 oz (79.289 kg), SpO2 98 %.  General appearance: alert, cooperative, no distress and mildly obese Neck: no carotid bruit and no JVD Lungs: clear to auscultation bilaterally Heart: irregularly irregular rhythm and 2-3 systolic murmur of AS Abdomen: non tender Extremities: no edema Pulses: 2+ and symmetric Skin: cool, pale, dry Neurologic: Grossly normal no obvious strength deficit or asymmetry, pupils equal.  Oriented to date, president  EKG AF with CVR  ASSESSMENT AND PLAN:   Mental status change Pt had transient confusion today. Her husband says she became "unresponsive" while watching TV this am. Pt thinks she "fell asleep".    Atrial fibrillation CVR   Chronic anticoagulation Coumadin Rx   Essential hypertension Controlled   Seizure 07/20/2011 Partial Complex Seizures with presentation as "absence"phenomena Katie [name] Rxed [name] 09/2011   Hepatic cirrhosis Unclear Etiology   Moderate aortic stenosis Echo 12/16/14    PLAN  Pt was seen by Katie Meda Coffee and myself today in the office. Katie Meda Coffee feels it would be best to send her to the ED for stroke evaluation.  [facility] KPA-C 02/02/2015 3:16 PM

## 2015-02-02 NOTE — Assessment & Plan Note (Signed)
Controlled.  

## 2015-02-02 NOTE — ED Provider Notes (Signed)
CSN: [id number]     Arrival date & time 02/02/15  1531 History   First MD Initiated Contact with Patient 02/02/15 1559     Chief Complaint  Patient presents with  . Altered Mental Status     (Consider location/radiation/quality/duration/timing/severity/associated sxs/prior Treatment) The history is provided by the patient and the spouse. The history is limited by the condition of the patient.  [name] is a 80 y.o. female history of partial complex seizure, hypertension, A. fib on Coumadin, pulmonary hypertension, diastolic CHF here with altered mental status and confusion. She woke up this morning and last normal was 8 AM. She sat down next to her husband to watch TV. Around 11 AM, he turned over and looked at her and noticed that she was difficult to arouse. He called her cardiologist and went to see cardiology around 2:30 PM. She has baseline shortness of breath that is unchanged. Husband denies any seizure activity. She did take Keppra this morning. She remembers waking up and watching TV. Didn't remember going to cardiology office    Level V caveat- AMS   Past Medical History  Diagnosis Date  . Epilepsy, focal August 2012    Initially diagnosed as TIA   . Essential hypertension   . Hyperlipidemia   . PVD (peripheral vascular disease)     Saw Dr Albertine Patricia 2007 - right common Iliac artery stenosis  . Uterine cancer   . Breast CA     Bilateral  . Atrial fibrillation     a. Dx 12/2014, s/p DCCV 01/2015. Xarelto changed to Coumadin due to elevated LFTs.  . Aortic stenosis     a. Mod-severe by echo 12/2014.  . Tachycardia   . Diastolic CHF     a. In setting of AF.   Marland Kitchen Hepatic cirrhosis   . Colon polyps     hyperplastic  . Elevated LFTs   . Pulmonary hypertension     a. Mod by echo 12/2014.  . Diverticulosis   . Elevated LFTs    Past Surgical History  Procedure Laterality Date  . Mastectomy Bilateral 2007  . Total abdominal hysterectomy  1990s  . Breast lumpectomy  Bilateral   . Cardioversion N/A 01/18/2015    Procedure: CARDIOVERSION;  Surgeon: Josue Hector, MD;  Location: [facility];  Service: Cardiovascular;  Laterality: N/A;   Family History  Problem Relation Age of Onset  . Hypertension Mother   . Breast cancer Daughter     BRCA-1  . Heart attack Father 16  . Kidney failure Mother 36  . Heart failure Brother   . Diabetes Brother   . Kidney cancer Brother   . Seizures Brother   . Alzheimer's disease Brother   . Breast cancer Other     niece  . Hypertension Sister    History  Substance Use Topics  . Smoking status: Never Smoker   . Smokeless tobacco: Never Used  . Alcohol Use: Yes     Comment: 2 glasses of wine daily   OB History    No data available     Review of Systems  Unable to perform ROS: Mental status change      Allergies  Review of patient's allergies indicates no known allergies.  Home Medications   Prior to Admission medications   Medication Sig Start Date End Date Taking? Authorizing Provider  furosemide (LASIX) 20 MG tablet Take 1 tablet (20 mg total) by mouth daily. 01/03/15   Josue Hector, MD  levETIRAcetam (  KEPPRA) 750 MG tablet Take 750 mg by mouth 2 (two) times daily.    Historical Provider, MD  lisinopril (PRINIVIL,ZESTRIL) 2.5 MG tablet Take 1 tablet (2.5 mg total) by mouth daily. 01/03/15   Josue Hector, MD  metoprolol tartrate (LOPRESSOR) 25 MG tablet Take 1 tablet (25 mg total) by mouth 2 (two) times daily. 12/12/14   Colon Branch, MD  potassium chloride (K-DUR) 10 MEQ tablet Take 10 mEq by mouth 2 (two) times daily. Patient taking two tablets by 20 meq by mouth daily. One one 10 meq in the morning and one 10 meq at lunch time    Historical Provider, MD  warfarin (COUMADIN) 5 MG tablet Take 1 tablet (5 mg total) by mouth daily. Patient taking differently: Take 5 mg by mouth daily. $RemoveBefo'5mg'JpXVrSpPfqD$  on Mondays and Fridays, 2.$RemoveBefore'5mg'fsRAYTnnVPijS$  all other days 12/19/14   Burtis Junes, NP   BP 118/64 mmHg  Pulse 85  Temp(Src)  97.7 F (36.5 C) (Oral)  Resp 22  SpO2 97% Physical Exam  Constitutional:  A &O x 2   HENT:  Head: Normocephalic.  Mouth/Throat: Oropharynx is clear and moist.  Eyes: Conjunctivae are normal. Pupils are equal, round, and reactive to light.  Neck: Normal range of motion. Neck supple.  Cardiovascular: Normal rate, regular rhythm and normal heart sounds.   Pulmonary/Chest: Effort normal and breath sounds normal. No respiratory distress. She has no wheezes. She has no rales.  Abdominal: Soft. Bowel sounds are normal. She exhibits no distension. There is no tenderness.  Musculoskeletal: Normal range of motion. She exhibits no edema or tenderness.  Neurological:  Alert, moving all extremities. Nl strength throughout. CN 2-12 intact   Nursing note and vitals reviewed.   ED Course  Procedures (including critical care time) Labs Review Labs Reviewed  COMPREHENSIVE METABOLIC PANEL - Abnormal; Notable for the following:    Glucose, Bld 121 (*)    AST 46 (*)    Alkaline Phosphatase 124 (*)    Total Bilirubin 1.3 (*)    GFR calc non Af Amer 53 (*)    GFR calc Af Amer 61 (*)    All other components within normal limits  CBC WITH DIFFERENTIAL/PLATELET - Abnormal; Notable for the following:    RBC 5.55 (*)    Hemoglobin 15.3 (*)    HCT 46.9 (*)    Neutrophils Relative % 83 (*)    Neutro Abs 8.1 (*)    Lymphocytes Relative 9 (*)    All other components within normal limits  URINALYSIS, ROUTINE W REFLEX MICROSCOPIC - Abnormal; Notable for the following:    APPearance HAZY (*)    Hgb urine dipstick SMALL (*)    Leukocytes, UA SMALL (*)    All other components within normal limits  PROTIME-INR - Abnormal; Notable for the following:    Prothrombin Time 20.4 (*)    INR 1.73 (*)    All other components within normal limits  URINE MICROSCOPIC-ADD ON - Abnormal; Notable for the following:    Squamous Epithelial / LPF MANY (*)    Bacteria, UA FEW (*)    All other components within normal  limits  CBG MONITORING, ED - Abnormal; Notable for the following:    Glucose-Capillary 131 (*)    All other components within normal limits  URINE CULTURE  CULTURE, BLOOD (ROUTINE X 2)  CULTURE, BLOOD (ROUTINE X 2)  BRAIN NATRIURETIC PEPTIDE  I-STAT TROPOININ, ED  I-STAT CG4 LACTIC ACID, ED    Imaging Review  Ct Head Wo Contrast  02/02/2015   CLINICAL DATA:  Altered mental status  EXAM: CT HEAD WITHOUT CONTRAST  TECHNIQUE: Contiguous axial images were obtained from the base of the skull through the vertex without intravenous contrast.  COMPARISON:  MRI 09/16/2011  FINDINGS: Generalized atrophy unchanged.  Negative for hydrocephalus.  Mild chronic microvascular ischemic change in the white matter. Negative for acute infarct. Negative for hemorrhage or mass  Calvarium is intact.  IMPRESSION: Atrophy and mild chronic microvascular ischemia. No acute abnormality.   Electronically Signed   By: Franchot Gallo M.D.   On: 02/02/2015 16:41   Dg Chest Port 1 View  02/02/2015   CLINICAL DATA:  Unresponsive  EXAM: PORTABLE CHEST - 1 VIEW  COMPARISON:  12/20/2014  FINDINGS: Patchy right lower lobe opacity, atelectasis versus pneumonia. Possible small right pleural effusion. No pneumothorax.  The heart is top-normal in size.  Surgical clips in the bilateral chest wall/ axilla.  IMPRESSION: Patchy right lower lobe opacity, atelectasis versus pneumonia.  Possible small right pleural effusion.   Electronically Signed   By: Julian Hy M.D.   On: 02/02/2015 16:27     EKG Interpretation   Date/Time:  Friday February 02 2015 15:39:57 EST Ventricular Rate:  84 PR Interval:    QRS Duration: 102 QT Interval:  386 QTC Calculation: 456 R Axis:   -33 Text Interpretation:  Atrial fibrillation Left axis deviation Minimal  voltage criteria for LVH, may be normal variant ST$RemoveBeforeDE' \\T'OJgcMsuHeTTuCwq$ \ T wave abnormality,  consider inferior ischemia or digitalis effect Abnormal ECG rate faster  since previous Confirmed by YAO  MD,  DAVID ([id number]) on 02/02/2015 3:59:50 PM      MDM   Final diagnoses:  Confusion    [name] is a 80 y.o. female here with AMS. Consider infection vs electrolyte imbalance vs TIA vs partial seizure. Outside window for TPA and symptoms improving. Will get CT head, labs, UA, CXR. Will consult neuro for eval and likely admit.   5:21 PM CXR showed pneumonia. UA ? UTI. Consulted Dr. Doy Mince to see patient. Started on levaquin. Will admit.    Wandra Arthurs, MD 02/02/15 [phone]

## 2015-02-02 NOTE — Assessment & Plan Note (Signed)
Echo 12/16/14

## 2015-02-02 NOTE — Assessment & Plan Note (Signed)
Pt had transient confusion today. Her husband says she became "unresponsive" while watching TV this am. Pt thinks she "fell asleep".

## 2015-02-02 NOTE — Telephone Encounter (Signed)
Patient's husband calling about her BP at 137/108, HR 70 irregular, thinks she is in A. Fib. Patient extremely fatigue after a full night sleep. Was going to go into hospital for [place] but insurance would not cover. Consulted Dr. Meda Coffee (DOD), suggested patient come in to see [facility] PA ([facility]). Appointment made at 2:30 today. Patient's husband agreed to plan.

## 2015-02-02 NOTE — Patient Instructions (Signed)
NO changes in medication  Please head over to [facility] ED for further evaluation   Follow up with Dr.Nishan as planned on 02/06/15 @ 3:30pm

## 2015-02-02 NOTE — ED Notes (Signed)
Dr Darl Householder given a copy of lactic acid results 2.48

## 2015-02-02 NOTE — H&P (Addendum)
PATIENT DETAILS Name: [name] Age: 80 y.o. Sex: female Date of Birth: Jul 09, 1935 Admit Date: 02/02/2015 VOH:KGOV Paz, MD   CHIEF COMPLAINT:  Brief loss of consciousness followed by confusion  HPI: [name] is a 80 y.o. female with a Past Medical History of partial comfort excision, chronic diastolic heart failure, atrial fibrillation on chronic Coumadin,? Cirrhosis, aortic stenosis who presents today with the above noted complaint. Apparently, around 11 AM, while watching TV, patient husband noticed that patient laid her head back and subsequently lost consciousness for a few minutes. Upon regaining consciousness, patient was confused for at least 2-3 hours. There was no history of tongue bite, urinary or fecal incontinence.Patient's husband, took her to the cardiology office, who referred the patient to the ED for further evaluation and treatment. During my evaluation, patient is completely awake and alert, and now back to usual baseline. No history of any fever, nausea, vomiting or diarrhea. Patient has a chronic cough that is essentially unchanged. No chest pain or palpitations prior to her syncopal episode   ALLERGIES:  No Known Allergies  PAST MEDICAL HISTORY: Past Medical History  Diagnosis Date  . Epilepsy, focal August 2012    Initially diagnosed as TIA   . Essential hypertension   . Hyperlipidemia   . PVD (peripheral vascular disease)     Saw Dr Albertine Patricia 2007 - right common Iliac artery stenosis  . Uterine cancer   . Breast CA     Bilateral  . Atrial fibrillation     a. Dx 12/2014, s/p DCCV 01/2015. Xarelto changed to Coumadin due to elevated LFTs.  . Aortic stenosis     a. Mod-severe by echo 12/2014.  . Tachycardia   . Diastolic CHF     a. In setting of AF.   Marland Kitchen Hepatic cirrhosis   . Colon polyps     hyperplastic  . Elevated LFTs   . Pulmonary hypertension     a. Mod by echo 12/2014.  . Diverticulosis   . Elevated LFTs     PAST SURGICAL  HISTORY: Past Surgical History  Procedure Laterality Date  . Mastectomy Bilateral 2007  . Total abdominal hysterectomy  1990s  . Breast lumpectomy Bilateral   . Cardioversion N/A 01/18/2015    Procedure: CARDIOVERSION;  Surgeon: Josue Hector, MD;  Location: [facility] ENDOSCOPY;  Service: Cardiovascular;  Laterality: N/A;    MEDICATIONS AT HOME: Prior to Admission medications   Medication Sig Start Date End Date Taking? Authorizing Provider  furosemide (LASIX) 20 MG tablet Take 1 tablet (20 mg total) by mouth daily. 01/03/15   Josue Hector, MD  levETIRAcetam (KEPPRA) 750 MG tablet Take 750 mg by mouth 2 (two) times daily.    Historical Provider, MD  lisinopril (PRINIVIL,ZESTRIL) 2.5 MG tablet Take 1 tablet (2.5 mg total) by mouth daily. 01/03/15   Josue Hector, MD  metoprolol tartrate (LOPRESSOR) 25 MG tablet Take 1 tablet (25 mg total) by mouth 2 (two) times daily. 12/12/14   Colon Branch, MD  potassium chloride (K-DUR) 10 MEQ tablet Take 10 mEq by mouth 2 (two) times daily. Patient taking two tablets by 20 meq by mouth daily. One one 10 meq in the morning and one 10 meq at lunch time    Historical Provider, MD  warfarin (COUMADIN) 5 MG tablet Take 1 tablet (5 mg total) by mouth daily. Patient taking differently: Take 5 mg by mouth daily. $RemoveBefo'5mg'tfTPLIWToKP$  on Mondays and Fridays, 2.$RemoveBefore'5mg'UFbCfQBYwuqIc$   all other days 12/19/14   Burtis Junes, NP    FAMILY HISTORY: Family History  Problem Relation Age of Onset  . Hypertension Mother   . Breast cancer Daughter     BRCA-1  . Heart attack Father 87  . Kidney failure Mother 74  . Heart failure Brother   . Diabetes Brother   . Kidney cancer Brother   . Seizures Brother   . Alzheimer's disease Brother   . Breast cancer Other     niece  . Hypertension Sister    SOCIAL HISTORY:  reports that she has never smoked. She has never used smokeless tobacco. She reports that she drinks alcohol. She reports that she does not use illicit drugs.  REVIEW OF SYSTEMS:    Constitutional:   No  weight loss, night sweats,  Fevers, chills, fatigue.  HEENT:    No headaches, Difficulty swallowing,Tooth/dental problems,Sore throat  Cardio-vascular: No chest pain,  Orthopnea, PND, swelling in lower extremities  GI:  No heartburn, indigestion, abdominal pain, nausea, vomiting  Resp: No shortness of breath with exertion or at rest.  No excess mucus, no productive cough,  No coughing up of blood  Skin:  no rash or lesions.  GU:  no dysuria, change in color of urine, no urgency or frequency.  No flank pain.  Musculoskeletal: No joint pain or swelling.  No decreased range of motion.  No back pain.  Psych: No change in mood or affect. No depression or anxiety.  No memory loss.   PHYSICAL EXAM: Blood pressure 150/68, pulse 56, temperature 97.7 F (36.5 C), temperature source Oral, resp. rate 23, SpO2 97 %.  General appearance :Awake, alert, not in any distress. Speech Clear. Not toxic Looking HEENT: Atraumatic and Normocephalic, pupils equally reactive to light and accomodation Neck: supple, no JVD. No cervical lymphadenopathy.  Chest:Good air entry bilaterally, no added sounds  CVS: S1 S2 irregular, +systolic murmur Abdomen: Bowel sounds present, Non tender and not distended with no gaurding, rigidity or rebound. Extremities: B/L Lower Ext shows no edema, both legs are warm to touch Neurology: Awake alert, and oriented X 3, CN II-XII intact, Non focal Skin:No Rash Wounds:N/A  LABS ON ADMISSION:   Recent Labs  02/02/15 1546  NA 139  K 4.1  CL 105  CO2 23  GLUCOSE 121*  BUN 9  CREATININE 1.00  CALCIUM 9.4    Recent Labs  02/02/15 1546  AST 46*  ALT 35  ALKPHOS 124*  BILITOT 1.3*  PROT 7.6  ALBUMIN 4.1   No results for input(s): LIPASE, AMYLASE in the last 72 hours.  Recent Labs  02/02/15 1546  WBC 9.8  NEUTROABS 8.1*  HGB 15.3*  HCT 46.9*  MCV 84.5  PLT 226   No results for input(s): CKTOTAL, CKMB, CKMBINDEX,  TROPONINI in the last 72 hours. No results for input(s): DDIMER in the last 72 hours. Invalid input(s): POCBNP   RADIOLOGIC STUDIES ON ADMISSION: Ct Head Wo Contrast  02/02/2015   CLINICAL DATA:  Altered mental status  EXAM: CT HEAD WITHOUT CONTRAST  TECHNIQUE: Contiguous axial images were obtained from the base of the skull through the vertex without intravenous contrast.  COMPARISON:  MRI 09/16/2011  FINDINGS: Generalized atrophy unchanged.  Negative for hydrocephalus.  Mild chronic microvascular ischemic change in the white matter. Negative for acute infarct. Negative for hemorrhage or mass  Calvarium is intact.  IMPRESSION: Atrophy and mild chronic microvascular ischemia. No acute abnormality.   Electronically Signed   By:  Franchot Gallo M.D.   On: 02/02/2015 16:41   Dg Chest Port 1 View  02/02/2015   CLINICAL DATA:  Unresponsive  EXAM: PORTABLE CHEST - 1 VIEW  COMPARISON:  12/20/2014  FINDINGS: Patchy right lower lobe opacity, atelectasis versus pneumonia. Possible small right pleural effusion. No pneumothorax.  The heart is top-normal in size.  Surgical clips in the bilateral chest wall/ axilla.  IMPRESSION: Patchy right lower lobe opacity, atelectasis versus pneumonia.  Possible small right pleural effusion.   Electronically Signed   By: Julian Hy M.D.   On: 02/02/2015 16:27     EKG: Independently reviewed. Atrial fibrillation  ASSESSMENT AND PLAN: Present on Admission:  . Syncope followed by transient confusion: Seizures vs cardiac syncope. Doubt TIA/CVA. Admit to telemetry, check MRI brain, EEG, echocardiogram. Will continue current dozing off Keppra, await neurology recommendations. Follow clinical course  . Hx of Complex Partial Seizures:as above-c/w Keppra  . ?PNA:CXR shows Right lower lobe infiltrate-afebrile-has chronic cough-empiric treatment with Rocephin/Zithromax. Could have aspiration component given syncope/possibility of seizures.   . Persistent atrial  fibrillation: Rate controlled, continue metoprolol, INR slightly subtherapeutic, will ask pharmacy to dose while inpatient.   . Chronic diastolic CHF (congestive heart failure) in the setting of atrial fibrillation: Clinically compensated. Continue Lasix and metoprolol.   . Essential hypertension: Stable, continue Lasix, metoprolol and lisinopril. Follow and adjust accordingly.  . Aortic Stenosis:await Echo  Further plan will depend as patient's clinical course evolves and further radiologic and laboratory data become available. Patient will be monitored closely.  Above noted plan was discussed with patient/spouse, they were in agreement.   DVT Prophylaxis: Coumadin per pharmacy  Code Status: Full Code  Disposition Plan: Home in 1-2 days  Total time spent for admission equals 45 minutes.  [facility] Pager [phone]  If 7PM-7AM, please contact night-coverage [email or web address] Password [email or web address] 02/02/2015, 6:01 PM

## 2015-02-02 NOTE — ED Notes (Signed)
Family at bedside. 

## 2015-02-02 NOTE — Progress Notes (Signed)
ANTICOAGULATION CONSULT NOTE - Initial Consult  Pharmacy Consult for warfarin Indication: atrial fibrillation  No Known Allergies  Patient Measurements:     Vital Signs: Temp: 97.7 F (36.5 C) (02/26 1544) Temp Source: Oral (02/26 1544) BP: 135/62 mmHg (02/26 1900) Pulse Rate: 96 (02/26 1900)  Labs:  Recent Labs  02/02/15 1546 02/02/15 1634  HGB 15.3*  --   HCT 46.9*  --   PLT 226  --   LABPROT  --  20.4*  INR  --  1.73*  CREATININE 1.00  --     Estimated Creatinine Clearance: 51.1 mL/min (by C-G formula based on Cr of 1).   Medical History: Past Medical History  Diagnosis Date  . Epilepsy, focal August 2012    Initially diagnosed as TIA   . Essential hypertension   . Hyperlipidemia   . PVD (peripheral vascular disease)     Saw Dr Albertine Patricia 2007 - right common Iliac artery stenosis  . Uterine cancer   . Breast CA     Bilateral  . Atrial fibrillation     a. Dx 12/2014, s/p DCCV 01/2015. Xarelto changed to Coumadin due to elevated LFTs.  . Aortic stenosis     a. Mod-severe by echo 12/2014.  . Tachycardia   . Diastolic CHF     a. In setting of AF.   Marland Kitchen Hepatic cirrhosis   . Colon polyps     hyperplastic  . Elevated LFTs   . Pulmonary hypertension     a. Mod by echo 12/2014.  . Diverticulosis   . Elevated LFTs     Assessment: 79 YOF brought in after loss of consciousness and followed by confusion. Was on warfarin PTA for AFib (was previously on Xarelto, but changed d/t elevated LFTs per PMH). Home dose of warfarin 5mg  on Mondays and 2.5mg  all other days- reduced after an elevated INR in clinic on 01/23/15. Admission INR today low at 1.7. Last dose yesterday. Baseline hgb  15.3, plts 226. No bleeding noted.  Goal of Therapy:  INR 2-3 Monitor platelets by anticoagulation protocol: Yes   Plan:  -warfarin 5mg  po x1 tonight -daily INR -follow for s/s bleeding  Breylon Sherrow D. Nichalos Brenton, PharmD, BCPS Clinical Pharmacist Pager: [phone] 02/02/2015 8:02 PM

## 2015-02-02 NOTE — Assessment & Plan Note (Signed)
CVR

## 2015-02-03 DIAGNOSIS — R55 Syncope and collapse: Secondary | ICD-10-CM

## 2015-02-03 DIAGNOSIS — I481 Persistent atrial fibrillation: Secondary | ICD-10-CM

## 2015-02-03 DIAGNOSIS — R4182 Altered mental status, unspecified: Secondary | ICD-10-CM | POA: Diagnosis not present

## 2015-02-03 LAB — BASIC METABOLIC PANEL
Anion gap: 10 (ref 5–15)
BUN: 9 mg/dL (ref 6–23)
CO2: 24 mmol/L (ref 19–32)
CREATININE: 0.84 mg/dL (ref 0.50–1.10)
Calcium: 8.8 mg/dL (ref 8.4–10.5)
Chloride: 104 mmol/L (ref 96–112)
GFR calc non Af Amer: 65 mL/min — ABNORMAL LOW (ref >90)
GFR, EST AFRICAN AMERICAN: 76 mL/min — AB (ref >90)
Glucose, Bld: 110 mg/dL — ABNORMAL HIGH (ref 70–99)
Potassium: 3.7 mmol/L (ref 3.5–5.1)
SODIUM: 138 mmol/L (ref 135–145)

## 2015-02-03 LAB — PROTIME-INR
INR: 2.07 — AB (ref 0.00–1.49)
PROTHROMBIN TIME: 23.5 s — AB (ref 11.6–15.2)

## 2015-02-03 LAB — LIPID PANEL
Cholesterol: 139 mg/dL (ref 0–200)
HDL: 42 mg/dL (ref >39)
LDL Cholesterol: 81 mg/dL (ref 0–99)
Total CHOL/HDL Ratio: 3.3 RATIO
Triglycerides: 79 mg/dL (ref <150)
VLDL: 16 mg/dL (ref 0–40)

## 2015-02-03 LAB — GLUCOSE, CAPILLARY
GLUCOSE-CAPILLARY: 112 mg/dL — AB (ref 70–99)
GLUCOSE-CAPILLARY: 103 mg/dL — AB (ref 70–99)
GLUCOSE-CAPILLARY: 93 mg/dL (ref 70–99)
Glucose-Capillary: 104 mg/dL — ABNORMAL HIGH (ref 70–99)

## 2015-02-03 LAB — CBC
HCT: 42.7 % (ref 36.0–46.0)
Hemoglobin: 14 g/dL (ref 12.0–15.0)
MCH: 27.4 pg (ref 26.0–34.0)
MCHC: 32.8 g/dL (ref 30.0–36.0)
MCV: 83.6 fL (ref 78.0–100.0)
PLATELETS: 180 10*3/uL (ref 150–400)
RBC: 5.11 MIL/uL (ref 3.87–5.11)
RDW: 14.5 % (ref 11.5–15.5)
WBC: 7.9 10*3/uL (ref 4.0–10.5)

## 2015-02-03 LAB — TROPONIN I
TROPONIN I: 0.06 ng/mL — AB (ref <0.031)
Troponin I: <0.03 ng/mL (ref <0.031)

## 2015-02-03 MED ORDER — LEVETIRACETAM 1000 MG PO TABS: 1000.0000 mg | ORAL_TABLET | Freq: Two times a day (BID) | ORAL | Status: AC

## 2015-02-03 MED ORDER — LEVETIRACETAM 500 MG PO TABS
1000.0000 mg | ORAL_TABLET | Freq: Two times a day (BID) | ORAL | Status: DC
  Administered 2015-02-03: 1000 mg via ORAL
  Filled 2015-02-03: qty 2

## 2015-02-03 MED ORDER — LEVOFLOXACIN 750 MG PO TABS: 750.0000 mg | ORAL_TABLET | Freq: Every day | ORAL | Status: AC

## 2015-02-03 NOTE — Progress Notes (Signed)
  Echocardiogram 2D Echocardiogram has been performed.  Avanell Shackleton M 02/03/2015, 9:48 AM

## 2015-02-03 NOTE — Consult Note (Signed)
NEURO HOSPITALIST CONSULT NOTE    Reason for Consult: transient confusion  HPI:                                                                                                                                          [name] is an 80 y.o. female with a past medical history that is relevant for HTN, hyperlipidemia, atrial fibrillation on chronic coumadin, aortic stenosis, chronic diastolic heart failure, PVD, bilateral breast cancer s/p mastectomy, uterine cancer, cognitive dysfunction, and focal seizures with impairment of consciousness followed by Dr. Jacelyn Grip at [facility], admitted to [facility] after sustaining a transient episode of impairment of consciousness at home. She said that she has not recollection of the event, but according to her hospital chart " apparently, around 11 AM, while watching TV, patient husband noticed that patient laid her head back and subsequently lost consciousness for a few minutes. Upon regaining consciousness, patient was confused for at least 2-3 hours. There was no history of tongue bite, urinary or fecal incontinence.Patient's husband, took her to the cardiology office, who referred the patient to the ED for further evaluation and treatment". She tells me that she never experiences a warning before her seizures and doesn't recall her last seizure.  Denies ever having a generalized seizure. Stated that she has been on keppra for a couple of years which she takes faithfully and doesn't seem to be causing side effects. I personally reviewed CT brain done upon admission which showed no acute abnormality. Serologies are unimpressive. At this moment, she denies HA, vertigo, double vision, focal weakness or numbness, slurred speech, language or vision impairment. Review of her prior evaluation by Dr. Jacelyn Grip couple of years ago: In retrospect, EEG was suspicious for right hemispheric rhythmic delta that was TIRDA like.Her first seizures was in  8/1. 2 nocturnal on 9/1, one during wakefulness on 10/1, 2 on 10/31, and then started Keppra 500 bid on that day. Because of fatigue she self weaned her Keppra to 250/500. Unfortunately she had another seizure on that dose. She increased it to 500 bid and then had another seizure. She self increased to 500/750 and then dropped it back to 500 bid. She has not had a seizure since approximately February when she increased to 500/750 and then reduced her dose".  Past Medical History  Diagnosis Date  . Epilepsy, focal August 2012    Initially diagnosed as TIA   . Essential hypertension   . Hyperlipidemia   . PVD (peripheral vascular disease)     Saw Dr Albertine Patricia 2007 - right common Iliac artery stenosis  . Uterine cancer   . Breast CA     Bilateral  . Atrial fibrillation     a. Dx 12/2014, s/p DCCV 01/2015. Xarelto changed  to Coumadin due to elevated LFTs.  . Aortic stenosis     a. Mod-severe by echo 12/2014.  . Tachycardia   . Diastolic CHF     a. In setting of AF.   Marland Kitchen Hepatic cirrhosis   . Colon polyps     hyperplastic  . Elevated LFTs   . Pulmonary hypertension     a. Mod by echo 12/2014.  . Diverticulosis   . Elevated LFTs     Past Surgical History  Procedure Laterality Date  . Mastectomy Bilateral 2007  . Total abdominal hysterectomy  1990s  . Breast lumpectomy Bilateral   . Cardioversion N/A 01/18/2015    Procedure: CARDIOVERSION;  Surgeon: Josue Hector, MD;  Location: [facility] ENDOSCOPY;  Service: Cardiovascular;  Laterality: N/A;    Family History  Problem Relation Age of Onset  . Hypertension Mother   . Breast cancer Daughter     BRCA-1  . Heart attack Father 42  . Kidney failure Mother 63  . Heart failure Brother   . Diabetes Brother   . Kidney cancer Brother   . Seizures Brother   . Alzheimer's disease Brother   . Breast cancer Other     niece  . Hypertension Sister     Family History: no epilepsy, brain tumors, brain aneurysms, or MS   Social History:   reports that she has never smoked. She has never used smokeless tobacco. She reports that she drinks alcohol. She reports that she does not use illicit drugs.  No Known Allergies  MEDICATIONS:                                                                                                                     Scheduled: . azithromycin  500 mg Oral Q24H  . cefTRIAXone (ROCEPHIN)  IV  1 g Intravenous Q24H  . furosemide  20 mg Oral Daily  . levETIRAcetam  750 mg Oral BID  . lisinopril  2.5 mg Oral Daily  . metoprolol tartrate  25 mg Oral BID  . sodium chloride  3 mL Intravenous Q12H  . Warfarin - Pharmacist Dosing Inpatient   Does not apply q1800     ROS:                                                                                                                                       History obtained from the patient and  chart review  General ROS: negative for - chills, fatigue, fever, night sweats, weight gain or weight loss Psychological ROS: negative for - behavioral disorder, hallucinations, mood swings or suicidal ideation Ophthalmic ROS: negative for - blurry vision, double vision, eye pain or loss of vision ENT ROS: negative for - epistaxis, nasal discharge, oral lesions, sore throat, tinnitus or vertigo Allergy and Immunology ROS: negative for - hives or itchy/watery eyes Hematological and Lymphatic ROS: negative for - bleeding problems, bruising or swollen lymph nodes Endocrine ROS: negative for - galactorrhea, hair pattern changes, polydipsia/polyuria or temperature intolerance Respiratory ROS: negative for - cough, hemoptysis, shortness of breath or wheezing Cardiovascular ROS: negative for - chest pain, dyspnea on exertion, edema or irregular heartbeat Gastrointestinal ROS: negative for - abdominal pain, diarrhea, hematemesis, nausea/vomiting or stool incontinence Genito-Urinary ROS: negative for - dysuria, hematuria, incontinence or urinary frequency/urgency Musculoskeletal  ROS: negative for - joint swelling or muscular weakness Neurological ROS: as noted in HPI Dermatological ROS: negative for rash and skin lesion changes  Physical exam: pleasant female in no apparent distress. Blood pressure 135/62, pulse 96, temperature 97.7 F (36.5 C), temperature source Oral, resp. rate 31, SpO2 100 %. Head: normocephalic. Neck: supple, no bruits, no JVD. Cardiac: no murmurs. Lungs: clear. Abdomen: soft, no tender, no mass. Extremities: no edema. Skin: no rash  Neurologic Examination:                                                                                                      General: Mental Status: Alert, oriented, thought content appropriate.  Speech fluent without evidence of aphasia.  Able to follow 3 step commands without difficulty. Cranial Nerves: II: Discs flat bilaterally; Visual fields grossly normal, pupils equal, round, reactive to light and accommodation III,IV, VI: ptosis not present, extra-ocular motions intact bilaterally V,VII: smile symmetric, facial light touch sensation normal bilaterally VIII: hearing normal bilaterally IX,X: gag reflex present XI: bilateral shoulder shrug XII: midline tongue extension without atrophy or fasciculations  Motor: Right : Upper extremity   5/5    Left:     Upper extremity   5/5  Lower extremity   5/5     Lower extremity   5/5 Tone and bulk:normal tone throughout; no atrophy noted Sensory: Pinprick and light touch intact throughout, bilaterally Deep Tendon Reflexes:  Right: Upper Extremity   Left: Upper extremity   biceps (C-5 to C-6) 2/4   biceps (C-5 to C-6) 2/4 tricep (C7) 2/4    triceps (C7) 2/4 Brachioradialis (C6) 2/4  Brachioradialis (C6) 2/4  Lower Extremity Lower Extremity  quadriceps (L-2 to L-4) 2/4   quadriceps (L-2 to L-4) 2/4 Achilles (S1) 2/4   Achilles (S1) 2/4  Plantars: Right: downgoing   Left: downgoing Cerebellar: normal finger-to-nose,  normal heel-to-shin test Gait:   Deferred for safety reasons    Lab Results  Component Value Date/Time   CHOL 277* 10/05/2014 11:34 AM    Results for orders placed or performed during the hospital encounter of 02/02/15 (from the past 48 hour(s))  Comprehensive metabolic panel     Status:  Abnormal   Collection Time: 02/02/15  3:46 PM  Result Value Ref Range   Sodium 139 135 - 145 mmol/L   Potassium 4.1 3.5 - 5.1 mmol/L   Chloride 105 96 - 112 mmol/L   CO2 23 19 - 32 mmol/L   Glucose, Bld 121 (H) 70 - 99 mg/dL   BUN 9 6 - 23 mg/dL   Creatinine, Ser 1.00 0.50 - 1.10 mg/dL   Calcium 9.4 8.4 - 10.5 mg/dL   Total Protein 7.6 6.0 - 8.3 g/dL   Albumin 4.1 3.5 - 5.2 g/dL   AST 46 (H) 0 - 37 U/L   ALT 35 0 - 35 U/L   Alkaline Phosphatase 124 (H) 39 - 117 U/L   Total Bilirubin 1.3 (H) 0.3 - 1.2 mg/dL   GFR calc non Af Amer 53 (L) >90 mL/min   GFR calc Af Amer 61 (L) >90 mL/min    Comment: (NOTE) The eGFR has been calculated using the CKD EPI equation. This calculation has not been validated in all clinical situations. eGFR's persistently <90 mL/min signify possible Chronic Kidney Disease.    Anion gap 11 5 - 15  CBC with Differential     Status: Abnormal   Collection Time: 02/02/15  3:46 PM  Result Value Ref Range   WBC 9.8 4.0 - 10.5 K/uL   RBC 5.55 (H) 3.87 - 5.11 MIL/uL   Hemoglobin 15.3 (H) 12.0 - 15.0 g/dL   HCT 46.9 (H) 36.0 - 46.0 %   MCV 84.5 78.0 - 100.0 fL   MCH 27.6 26.0 - 34.0 pg   MCHC 32.6 30.0 - 36.0 g/dL   RDW 14.5 11.5 - 15.5 %   Platelets 226 150 - 400 K/uL   Neutrophils Relative % 83 (H) 43 - 77 %   Neutro Abs 8.1 (H) 1.7 - 7.7 K/uL   Lymphocytes Relative 9 (L) 12 - 46 %   Lymphs Abs 0.9 0.7 - 4.0 K/uL   Monocytes Relative 8 3 - 12 %   Monocytes Absolute 0.8 0.1 - 1.0 K/uL   Eosinophils Relative 0 0 - 5 %   Eosinophils Absolute 0.0 0.0 - 0.7 K/uL   Basophils Relative 0 0 - 1 %   Basophils Absolute 0.0 0.0 - 0.1 K/uL  Brain natriuretic peptide     Status: Abnormal   Collection  Time: 02/02/15  3:46 PM  Result Value Ref Range   B Natriuretic Peptide 412.4 (H) 0.0 - 100.0 pg/mL  CBG monitoring, ED     Status: Abnormal   Collection Time: 02/02/15  3:56 PM  Result Value Ref Range   Glucose-Capillary 131 (H) 70 - 99 mg/dL  I-Stat Troponin, ED (not at [facility])     Status: None   Collection Time: 02/02/15  4:14 PM  Result Value Ref Range   Troponin i, poc 0.01 0.00 - 0.08 ng/mL   Comment 3            Comment: Due to the release kinetics of cTnI, a negative result within the first hours of the onset of symptoms does not rule out myocardial infarction with certainty. If myocardial infarction is still suspected, repeat the test at appropriate intervals.   Protime-INR     Status: Abnormal   Collection Time: 02/02/15  4:34 PM  Result Value Ref Range   Prothrombin Time 20.4 (H) 11.6 - 15.2 seconds   INR 1.73 (H) 0.00 - 1.49  Urinalysis, Routine w reflex microscopic     Status:  Abnormal   Collection Time: 02/02/15  4:49 PM  Result Value Ref Range   Color, Urine YELLOW YELLOW   APPearance HAZY (A) CLEAR   Specific Gravity, Urine 1.016 1.005 - 1.030   pH 5.0 5.0 - 8.0   Glucose, UA NEGATIVE NEGATIVE mg/dL   Hgb urine dipstick SMALL (A) NEGATIVE   Bilirubin Urine NEGATIVE NEGATIVE   Ketones, ur NEGATIVE NEGATIVE mg/dL   Protein, ur NEGATIVE NEGATIVE mg/dL   Urobilinogen, UA 0.2 0.0 - 1.0 mg/dL   Nitrite NEGATIVE NEGATIVE   Leukocytes, UA SMALL (A) NEGATIVE  Urine microscopic-add on     Status: Abnormal   Collection Time: 02/02/15  4:49 PM  Result Value Ref Range   Squamous Epithelial / LPF MANY (A) RARE   WBC, UA 7-10 <3 WBC/hpf   RBC / HPF 0-2 <3 RBC/hpf   Bacteria, UA FEW (A) RARE   Urine-Other MUCOUS PRESENT   I-Stat CG4 Lactic Acid, ED     Status: Abnormal   Collection Time: 02/02/15  6:06 PM  Result Value Ref Range   Lactic Acid, Venous 2.48 (HH) 0.5 - 2.0 mmol/L   Comment NOTIFIED PHYSICIAN   Troponin I (q 6hr x 3)     Status: None   Collection Time:  02/02/15  9:00 PM  Result Value Ref Range   Troponin I <0.03 <0.031 ng/mL    Comment:        NO INDICATION OF MYOCARDIAL INJURY.     Ct Head Wo Contrast  02/02/2015   CLINICAL DATA:  Altered mental status  EXAM: CT HEAD WITHOUT CONTRAST  TECHNIQUE: Contiguous axial images were obtained from the base of the skull through the vertex without intravenous contrast.  COMPARISON:  MRI 09/16/2011  FINDINGS: Generalized atrophy unchanged.  Negative for hydrocephalus.  Mild chronic microvascular ischemic change in the white matter. Negative for acute infarct. Negative for hemorrhage or mass  Calvarium is intact.  IMPRESSION: Atrophy and mild chronic microvascular ischemia. No acute abnormality.   Electronically Signed   By: Marlan Palau M.D.   On: 02/02/2015 16:41   Mri Brain Without Contrast  02/03/2015   CLINICAL DATA:  Seizure, loss of consciousness while watching TV this morning. Subsequent confusion. History of CHF, atrial fibrillation, cirrhosis, breast cancer, uterine cancer.  EXAM: MRI HEAD WITHOUT CONTRAST  TECHNIQUE: Multiplanar, multiecho pulse sequences of the brain and surrounding structures were obtained without intravenous contrast.  COMPARISON:  CT of the head February 02 2015 at 1636 hours and MRI of the head September 16, 2011  FINDINGS: No reduced diffusion to suggest acute ischemia. No susceptibility artifact to suggest hemorrhage.  Moderate ventriculomegaly, likely on the basis of global parenchymal brain volume loss as there is overall commensurate enlargement of cerebral sulci and cerebellar folia, mildly progressed from prior imaging. No midline shift, mass effect or mass lesions. Moderate to severe white matter changes suggest chronic small vessel ischemic disease.  No abnormal extra-axial fluid collections. Normal major intracranial vascular flow voids seen at the skull base. There are temporomandibular osteoarthrosis.  Ocular globes and orbital contents are unremarkable. No paranasal  sinus air-fluid levels, mastoid air cells appear well-aerated. No abnormal sellar expansion. No cerebellar tonsillar ectopia. No suspicious calvarial bone marrow signal.  Bilateral hippocampi demonstrate normal size, morphology and signal characteristics; mild edema within the RIGHT hippocampus on prior imaging is not present today.  IMPRESSION: No acute intracranial process.  Involutional changes. Moderate delete data white matter changes suggest chronic small vessel ischemic disease.  Electronically Signed   By: Elon Alas   On: 02/03/2015 00:48   Dg Chest Port 1 View  02/02/2015   CLINICAL DATA:  Unresponsive  EXAM: PORTABLE CHEST - 1 VIEW  COMPARISON:  12/20/2014  FINDINGS: Patchy right lower lobe opacity, atelectasis versus pneumonia. Possible small right pleural effusion. No pneumothorax.  The heart is top-normal in size.  Surgical clips in the bilateral chest wall/ axilla.  IMPRESSION: Patchy right lower lobe opacity, atelectasis versus pneumonia.  Possible small right pleural effusion.   Electronically Signed   By: Julian Hy M.D.   On: 02/02/2015 16:27   Assessment/Plan: 80 y/o with known history focal epilepsy with impairment of consciousness, cognitive disorder, admitted after sustaining another paroxysmal event reminiscent of seizure. I doubt TIA. Would like to see if she can tolerate a modest increase in the dose of keppra (750-1,000 mg). She is back to baseline. Neurology will follow up.  Dorian Pod, MD 02/03/2015, 12:56 AM  [facility]

## 2015-02-03 NOTE — Progress Notes (Signed)
UR completed 

## 2015-02-03 NOTE — Discharge Summary (Signed)
Physician Discharge Summary  KAIZLEY AJA QIH:[id number] DOB: November 26, 1935 DOA: 02/02/2015  PCP: Kathlene November, MD  Admit date: 02/02/2015 Discharge date: 02/03/2015  Time spent: *25 minutes  Recommendations for Outpatient Follow-up:  1. *Follow-up cardiologist in 3 days, patient has an appointment on Tuesday. Will follow up echo results at office of Dr. Jenkins Rouge. 2. Carotid Dopplers can be done as an outpatient as per neuro recommendation 3. Follow PCP in 2 weeks  Discharge Diagnoses:  Principal Problem:   Seizure Active Problems:   Essential hypertension   Aortic stenosis   Persistent atrial fibrillation   Chronic diastolic CHF (congestive heart failure) in the setting of atrial fibrillation   Confusion   Discharge Condition: Stable  Diet recommendation: Low-salt diet  Filed Weights   02/02/15 2000  Weight: 78.9 kg (173 lb 15.1 oz)    History of present illness:  80 y.o. female with a Past Medical History of partial comfort excision, chronic diastolic heart failure, atrial fibrillation on chronic Coumadin,? Cirrhosis, aortic stenosis who presents today with the above noted complaint. Apparently, around 11 AM, while watching TV, patient husband noticed that patient laid her head back and subsequently lost consciousness for a few minutes. Upon regaining consciousness, patient was confused for at least 2-3 hours. There was no history of tongue bite, urinary or fecal incontinence.Patient's husband, took her to the cardiology office, who referred the patient to the ED for further evaluation and treatment. During my evaluation, patient is completely awake and alert, and now back to usual baseline. No history of any fever, nausea, vomiting or diarrhea. Patient has a chronic cough that is essentially unchanged.  Hospital Course:  Syncope versus seizure- patient has history of complex partial seizures, has been seen by neurology. Keppra dose increased to 1000 mg twice a day.  Echocardiogram was done in the hospital but patient does not want to wait for echo results. We'll discharge the patient on Keppra 1000 by mouth twice a day and she'll follow up with her cardiologist Dr. Jenkins Rouge on Tuesday next week and discuss echo results at that time.  Pneumonia -Chest x-ray showed possible right lower lobe infiltrate, she received ceftriaxone and through max in the ED. Will discharge on Levaquin 750 mg by mouth daily for 5 days.  Hypertension- blood pressure is stable continue Lasix metoprolol lisinopril.  Chronic diastolic CHF- clinically compensated. Continue Lasix and metoprolol.   Procedures:  Echocardiogram done- results pending  Consultations:  Neurology  Discharge Exam: Filed Vitals:   02/03/15 1005  BP: 129/77  Pulse: 68  Temp:   Resp: 20    General: Appearing no acute distress Cardiovascular: S1-S2 regular Respiratory: Clear bilaterally  Discharge Instructions   Discharge Instructions    Diet - low sodium heart healthy    Complete by:  As directed      Increase activity slowly    Complete by:  As directed           Current Discharge Medication List    START taking these medications   Details  levofloxacin (LEVAQUIN) 750 MG tablet Take 1 tablet (750 mg total) by mouth daily. Qty: 5 tablet, Refills: 0      CONTINUE these medications which have CHANGED   Details  levETIRAcetam (KEPPRA) 1000 MG tablet Take 1 tablet (1,000 mg total) by mouth 2 (two) times daily. Qty: 60 tablet, Refills: 2      CONTINUE these medications which have NOT CHANGED   Details  furosemide (LASIX) 20 MG tablet Take  1 tablet (20 mg total) by mouth daily. Qty: 90 tablet, Refills: 3    lisinopril (PRINIVIL,ZESTRIL) 2.5 MG tablet Take 1 tablet (2.5 mg total) by mouth daily. Qty: 90 tablet, Refills: 3    metoprolol tartrate (LOPRESSOR) 25 MG tablet Take 1 tablet (25 mg total) by mouth 2 (two) times daily. Qty: 60 tablet, Refills: 1    potassium chloride  (K-DUR) 10 MEQ tablet Take 10 mEq by mouth 2 (two) times daily. Patient taking two tablets by 20 meq by mouth daily. One one 10 meq in the morning and one 10 meq at lunch time    warfarin (COUMADIN) 5 MG tablet Take 1 tablet (5 mg total) by mouth daily. Qty: 90 tablet, Refills: 3       No Known Allergies    The results of significant diagnostics from this hospitalization (including imaging, microbiology, ancillary and laboratory) are listed below for reference.    Significant Diagnostic Studies: Ct Head Wo Contrast  02/02/2015   CLINICAL DATA:  Altered mental status  EXAM: CT HEAD WITHOUT CONTRAST  TECHNIQUE: Contiguous axial images were obtained from the base of the skull through the vertex without intravenous contrast.  COMPARISON:  MRI 09/16/2011  FINDINGS: Generalized atrophy unchanged.  Negative for hydrocephalus.  Mild chronic microvascular ischemic change in the white matter. Negative for acute infarct. Negative for hemorrhage or mass  Calvarium is intact.  IMPRESSION: Atrophy and mild chronic microvascular ischemia. No acute abnormality.   Electronically Signed   By: Franchot Gallo M.D.   On: 02/02/2015 16:41   Mri Brain Without Contrast  02/03/2015   CLINICAL DATA:  Seizure, loss of consciousness while watching TV this morning. Subsequent confusion. History of CHF, atrial fibrillation, cirrhosis, breast cancer, uterine cancer.  EXAM: MRI HEAD WITHOUT CONTRAST  TECHNIQUE: Multiplanar, multiecho pulse sequences of the brain and surrounding structures were obtained without intravenous contrast.  COMPARISON:  CT of the head February 02 2015 at 1636 hours and MRI of the head September 16, 2011  FINDINGS: No reduced diffusion to suggest acute ischemia. No susceptibility artifact to suggest hemorrhage.  Moderate ventriculomegaly, likely on the basis of global parenchymal brain volume loss as there is overall commensurate enlargement of cerebral sulci and cerebellar folia, mildly progressed from  prior imaging. No midline shift, mass effect or mass lesions. Moderate to severe white matter changes suggest chronic small vessel ischemic disease.  No abnormal extra-axial fluid collections. Normal major intracranial vascular flow voids seen at the skull base. There are temporomandibular osteoarthrosis.  Ocular globes and orbital contents are unremarkable. No paranasal sinus air-fluid levels, mastoid air cells appear well-aerated. No abnormal sellar expansion. No cerebellar tonsillar ectopia. No suspicious calvarial bone marrow signal.  Bilateral hippocampi demonstrate normal size, morphology and signal characteristics; mild edema within the RIGHT hippocampus on prior imaging is not present today.  IMPRESSION: No acute intracranial process.  Involutional changes. Moderate delete data white matter changes suggest chronic small vessel ischemic disease.   Electronically Signed   By: Elon Alas   On: 02/03/2015 00:48   Dg Chest Port 1 View  02/02/2015   CLINICAL DATA:  Unresponsive  EXAM: PORTABLE CHEST - 1 VIEW  COMPARISON:  12/20/2014  FINDINGS: Patchy right lower lobe opacity, atelectasis versus pneumonia. Possible small right pleural effusion. No pneumothorax.  The heart is top-normal in size.  Surgical clips in the bilateral chest wall/ axilla.  IMPRESSION: Patchy right lower lobe opacity, atelectasis versus pneumonia.  Possible small right pleural effusion.   Electronically  Signed   By: Julian Hy M.D.   On: 02/02/2015 16:27    Microbiology: No results found for this or any previous visit (from the past 240 hour(s)).   Labs: Basic Metabolic Panel:  Recent Labs Lab 02/02/15 1546 02/03/15 0243  NA 139 138  K 4.1 3.7  CL 105 104  CO2 23 24  GLUCOSE 121* 110*  BUN 9 9  CREATININE 1.00 0.84  CALCIUM 9.4 8.8   Liver Function Tests:  Recent Labs Lab 01/29/15 0814 02/02/15 1546  AST 26 46*  ALT 21 35  ALKPHOS 98 124*  BILITOT 0.9 1.3*  PROT 6.8 7.6  ALBUMIN 3.8 4.1    No results for input(s): LIPASE, AMYLASE in the last 168 hours. No results for input(s): AMMONIA in the last 168 hours. CBC:  Recent Labs Lab 02/02/15 1546 02/03/15 0243  WBC 9.8 7.9  NEUTROABS 8.1*  --   HGB 15.3* 14.0  HCT 46.9* 42.7  MCV 84.5 83.6  PLT 226 180   Cardiac Enzymes:  Recent Labs Lab 02/02/15 2100 02/03/15 0243  TROPONINI <0.03 <0.03   BNP: BNP (last 3 results)  Recent Labs  12/15/14 1616 12/16/14 0841 02/02/15 1546  BNP 345.9* 378.3* 412.4*    ProBNP (last 3 results) No results for input(s): PROBNP in the last 8760 hours.  CBG:  Recent Labs Lab 02/02/15 1556 02/03/15 0218 02/03/15 0705 02/03/15 0740 02/03/15 1134  GLUCAP 131* 112* 104* 103* 93       Signed:  Tobi Leinweber S  [facility] 02/03/2015, 12:59 PM

## 2015-02-03 NOTE — Progress Notes (Addendum)
Subjective: Patient has had no further episodes of unresponsiveness.  Keppra not yet increased.  Back to baseline.    Objective: Current vital signs: BP 122/50 mmHg  Pulse 68  Temp(Src) 98.6 F (37 C) (Oral)  Resp 16  Ht 5\' 6"  (1.676 m)  Wt 78.9 kg (173 lb 15.1 oz)  BMI 28.09 kg/m2  SpO2 97% Vital signs in last 24 hours: Temp:  [97.6 F (36.4 C)-98.6 F (37 C)] 98.6 F (37 C) (02/27 0800) Pulse Rate:  [56-96] 68 (02/27 0800) Resp:  [16-31] 16 (02/27 0800) BP: (99-150)/(50-91) 122/50 mmHg (02/27 0800) SpO2:  [96 %-100 %] 97 % (02/27 0600) Weight:  [78.9 kg (173 lb 15.1 oz)-79.289 kg (174 lb 12.8 oz)] 78.9 kg (173 lb 15.1 oz) (02/26 2000)  Intake/Output from previous day:   Intake/Output this shift:   Nutritional status:    Neurologic Exam: Mental Status: Alert, oriented, thought content appropriate. Speech fluent without evidence of aphasia. Able to follow 3 step commands without difficulty. Cranial Nerves: II: Discs flat bilaterally; Visual fields grossly normal, pupils equal, round, reactive to light and accommodation III,IV, VI: ptosis not present, extra-ocular motions intact bilaterally V,VII: smile symmetric, facial light touch sensation normal bilaterally VIII: hearing normal bilaterally IX,X: gag reflex present XI: bilateral shoulder shrug XII: midline tongue extension without atrophy or fasciculations  Motor: Right :Upper extremity 5/5Left: Upper extremity 5/5 Lower extremity 5/5Lower extremity 5/5 Tone and bulk:normal tone throughout; no atrophy noted Sensory: Pinprick and light touch intact throughout, bilaterally Deep Tendon Reflexes:  Right: Upper Extremity Left: Upper extremity   biceps (C-5 to C-6) 2/4 biceps (C-5 to C-6) 2/4 tricep (C7) 2/4triceps (C7)  2/4 Brachioradialis (C6) 2/4Brachioradialis (C6) 2/4  Lower Extremity Lower Extremity  quadriceps (L-2 to L-4) 2/4 quadriceps (L-2 to L-4) 2/4 Achilles (S1) 2/4Achilles (S1) 2/4  Plantars: Right: downgoingLeft: downgoing Cerebellar: normal finger-to-nose, normal heel-to-shin testing bilaterally   Lab Results: Basic Metabolic Panel:  Recent Labs Lab 02/02/15 1546 02/03/15 0243  NA 139 138  K 4.1 3.7  CL 105 104  CO2 23 24  GLUCOSE 121* 110*  BUN 9 9  CREATININE 1.00 0.84  CALCIUM 9.4 8.8    Liver Function Tests:  Recent Labs Lab 01/29/15 0814 02/02/15 1546  AST 26 46*  ALT 21 35  ALKPHOS 98 124*  BILITOT 0.9 1.3*  PROT 6.8 7.6  ALBUMIN 3.8 4.1   No results for input(s): LIPASE, AMYLASE in the last 168 hours. No results for input(s): AMMONIA in the last 168 hours.  CBC:  Recent Labs Lab 02/02/15 1546 02/03/15 0243  WBC 9.8 7.9  NEUTROABS 8.1*  --   HGB 15.3* 14.0  HCT 46.9* 42.7  MCV 84.5 83.6  PLT 226 180    Cardiac Enzymes:  Recent Labs Lab 02/02/15 2100 02/03/15 0243  TROPONINI <0.03 <0.03    Lipid Panel:  Recent Labs Lab 02/03/15 0243  CHOL 139  TRIG 79  HDL 42  CHOLHDL 3.3  VLDL 16  LDLCALC 81    CBG:  Recent Labs Lab 02/02/15 1556 02/03/15 0218 02/03/15 0705 02/03/15 0740  GLUCAP 131* 112* 104* 103*    Microbiology: No results found for this or any previous visit.  Coagulation Studies:  Recent Labs  02/02/15 1634 02/03/15 0243  LABPROT 20.4* 23.5*  INR 1.73* 2.07*    Imaging: Ct Head Wo Contrast  02/02/2015   CLINICAL DATA:  Altered mental status  EXAM: CT HEAD WITHOUT CONTRAST  TECHNIQUE: Contiguous axial images were obtained from  the base of the skull through the vertex without intravenous contrast.  COMPARISON:  MRI 09/16/2011  FINDINGS: Generalized atrophy unchanged.  Negative for hydrocephalus.   Mild chronic microvascular ischemic change in the white matter. Negative for acute infarct. Negative for hemorrhage or mass  Calvarium is intact.  IMPRESSION: Atrophy and mild chronic microvascular ischemia. No acute abnormality.   Electronically Signed   By: Franchot Gallo M.D.   On: 02/02/2015 16:41   Mri Brain Without Contrast  02/03/2015   CLINICAL DATA:  Seizure, loss of consciousness while watching TV this morning. Subsequent confusion. History of CHF, atrial fibrillation, cirrhosis, breast cancer, uterine cancer.  EXAM: MRI HEAD WITHOUT CONTRAST  TECHNIQUE: Multiplanar, multiecho pulse sequences of the brain and surrounding structures were obtained without intravenous contrast.  COMPARISON:  CT of the head February 02 2015 at 1636 hours and MRI of the head September 16, 2011  FINDINGS: No reduced diffusion to suggest acute ischemia. No susceptibility artifact to suggest hemorrhage.  Moderate ventriculomegaly, likely on the basis of global parenchymal brain volume loss as there is overall commensurate enlargement of cerebral sulci and cerebellar folia, mildly progressed from prior imaging. No midline shift, mass effect or mass lesions. Moderate to severe white matter changes suggest chronic small vessel ischemic disease.  No abnormal extra-axial fluid collections. Normal major intracranial vascular flow voids seen at the skull base. There are temporomandibular osteoarthrosis.  Ocular globes and orbital contents are unremarkable. No paranasal sinus air-fluid levels, mastoid air cells appear well-aerated. No abnormal sellar expansion. No cerebellar tonsillar ectopia. No suspicious calvarial bone marrow signal.  Bilateral hippocampi demonstrate normal size, morphology and signal characteristics; mild edema within the RIGHT hippocampus on prior imaging is not present today.  IMPRESSION: No acute intracranial process.  Involutional changes. Moderate delete data white matter changes suggest chronic small vessel  ischemic disease.   Electronically Signed   By: Elon Alas   On: 02/03/2015 00:48   Dg Chest Port 1 View  02/02/2015   CLINICAL DATA:  Unresponsive  EXAM: PORTABLE CHEST - 1 VIEW  COMPARISON:  12/20/2014  FINDINGS: Patchy right lower lobe opacity, atelectasis versus pneumonia. Possible small right pleural effusion. No pneumothorax.  The heart is top-normal in size.  Surgical clips in the bilateral chest wall/ axilla.  IMPRESSION: Patchy right lower lobe opacity, atelectasis versus pneumonia.  Possible small right pleural effusion.   Electronically Signed   By: Julian Hy M.D.   On: 02/02/2015 16:27    Medications:  I have reviewed the patient's current medications. Scheduled: . azithromycin  500 mg Oral Q24H  . cefTRIAXone (ROCEPHIN)  IV  1 g Intravenous Q24H  . furosemide  20 mg Oral Daily  . levETIRAcetam  750 mg Oral BID  . lisinopril  2.5 mg Oral Daily  . metoprolol tartrate  25 mg Oral BID  . sodium chloride  3 mL Intravenous Q12H  . Warfarin - Pharmacist Dosing Inpatient   Does not apply q1800    Assessment/Plan: Patient appears to be back to baseline.  Has had no further events.  Suspect breakthrough seizure but patient has not had one since August of last year and this presentation is somewhat atypical.  Patient is being worked up for TIA as well although less likely.  MRI of the brain personally reviewed and shows no acute changes.  Echocardiogram pending.    Recommendations: 1.  Increase Keppra to 1000mg  BID 2.  Will follow up results of echocardiogram 3.  Carotid doppler-may be performed  as an outpatient   LOS: 1 day   Alexis Goodell, MD [facility] [phone] 02/03/2015  9:42 AM

## 2015-02-03 NOTE — Progress Notes (Signed)
DC instructions given to patient and her husband. All questions answered. Patient in [facility] escorted by Probation officer to lobby. Husband to transport home in private vehicle.

## 2015-02-04 LAB — URINE CULTURE
COLONY COUNT: NO GROWTH
CULTURE: NO GROWTH

## 2015-02-04 LAB — HIV ANTIBODY (ROUTINE TESTING W REFLEX): HIV Screen 4th Generation wRfx: NONREACTIVE

## 2015-02-05 ENCOUNTER — Telehealth: Payer: Self-pay | Admitting: Internal Medicine

## 2015-02-05 ENCOUNTER — Other Ambulatory Visit: Payer: Self-pay

## 2015-02-05 LAB — HEMOGLOBIN A1C
HEMOGLOBIN A1C: 6.4 % — AB (ref 4.8–5.6)
MEAN PLASMA GLUCOSE: 137 mg/dL

## 2015-02-05 MED ORDER — FUROSEMIDE 20 MG PO TABS: 20.0000 mg | ORAL_TABLET | Freq: Every day | ORAL | Status: AC

## 2015-02-05 MED ORDER — POTASSIUM CHLORIDE ER 10 MEQ PO TBCR: EXTENDED_RELEASE_TABLET | ORAL | Status: AC

## 2015-02-05 MED ORDER — LISINOPRIL 2.5 MG PO TABS: 2.5000 mg | ORAL_TABLET | Freq: Every day | ORAL | Status: AC

## 2015-02-05 NOTE — Telephone Encounter (Signed)
Caller name:[name] Relationship to patient: Husband Can be reached:[phone] Pharmacy:Mail order - [facility] [phone]  Reason for call: PT requesting 90 day supply sent in for metoprolol tartrate (LOPRESSOR) 25 MG tablet - has 8 doses left- PT ok with doing 30 days RX  at local [facility] in [place], and then put in 90 days through mail  Order.   PT was admitted to hospital over the weekend- diagnosed with having a seizure / UTI and pneumonia. Follow up is set with Cardiac Dr. Johnsie Cancel. Dr. Jacelyn Grip neurologist @ [facility] has been notified.  PT is home now and feeling better/

## 2015-02-05 NOTE — Telephone Encounter (Signed)
Sent by Dr. Kyla Balzarine office.

## 2015-02-06 ENCOUNTER — Encounter: Payer: Self-pay | Admitting: Internal Medicine

## 2015-02-06 ENCOUNTER — Ambulatory Visit (INDEPENDENT_AMBULATORY_CARE_PROVIDER_SITE_OTHER): Payer: Medicare Other | Admitting: *Deleted

## 2015-02-06 ENCOUNTER — Other Ambulatory Visit: Payer: Medicare Other

## 2015-02-06 ENCOUNTER — Ambulatory Visit (INDEPENDENT_AMBULATORY_CARE_PROVIDER_SITE_OTHER): Payer: Medicare Other | Admitting: Internal Medicine

## 2015-02-06 ENCOUNTER — Ambulatory Visit (INDEPENDENT_AMBULATORY_CARE_PROVIDER_SITE_OTHER): Payer: Medicare Other | Admitting: Cardiovascular Disease

## 2015-02-06 VITALS — BP 130/84 | HR 80 | Ht 67.0 in | Wt 173.0 lb

## 2015-02-06 VITALS — BP 130/78 | HR 69 | Ht 67.0 in | Wt 174.8 lb

## 2015-02-06 DIAGNOSIS — Z7901 Long term (current) use of anticoagulants: Secondary | ICD-10-CM

## 2015-02-06 DIAGNOSIS — K7469 Other cirrhosis of liver: Secondary | ICD-10-CM

## 2015-02-06 DIAGNOSIS — I4819 Other persistent atrial fibrillation: Secondary | ICD-10-CM

## 2015-02-06 DIAGNOSIS — R7989 Other specified abnormal findings of blood chemistry: Secondary | ICD-10-CM

## 2015-02-06 DIAGNOSIS — I10 Essential (primary) hypertension: Secondary | ICD-10-CM

## 2015-02-06 DIAGNOSIS — I480 Paroxysmal atrial fibrillation: Secondary | ICD-10-CM

## 2015-02-06 DIAGNOSIS — Z5181 Encounter for therapeutic drug level monitoring: Secondary | ICD-10-CM

## 2015-02-06 DIAGNOSIS — I350 Nonrheumatic aortic (valve) stenosis: Secondary | ICD-10-CM

## 2015-02-06 DIAGNOSIS — R932 Abnormal findings on diagnostic imaging of liver and biliary tract: Secondary | ICD-10-CM

## 2015-02-06 DIAGNOSIS — R945 Abnormal results of liver function studies: Secondary | ICD-10-CM

## 2015-02-06 DIAGNOSIS — I481 Persistent atrial fibrillation: Secondary | ICD-10-CM

## 2015-02-06 LAB — POCT INR: INR: 2.6

## 2015-02-06 MED ORDER — WARFARIN SODIUM 5 MG PO TABS: 5.0000 mg | ORAL_TABLET | ORAL | Status: AC

## 2015-02-06 NOTE — Assessment & Plan Note (Signed)
Well controlled.  Continue current medications and low sodium Dash type diet.    

## 2015-02-06 NOTE — Progress Notes (Signed)
Patient ID: [name], female   DOB: [birth year], 80 y.o.   MRN: [id number] 79 y.o. female with a Past Medical History of partial complex seizures, diastolic CHF and afib.  Had significant alcohol use on initial evaluation  and diagnosed with cirrhosis  With  elevated LFTls  2/26  AST 47 off statin now Seems that some of her elevation may have been due to passive congestion from diastolic CHF when in afib.  She also has moderate AS  Study Conclusions  Echo 02/03/15    - Left ventricle: Poor acoustic windows limit study. OVreall LVEF appears grossly normal. Cannot fully evaluate regional wall motion as endocardium not well seen for all wall segments. The cavity size was normal. Wall thickness was normal. - Aortic valve: AV is thickened, calcified with restricted motion. Peak and mean gradients through the valve are 32 and 21 mm Hg respectively consistent with mild AS> By 2 D imaging AV appears mild to moderately narrowed. There was mild regurgitation. - Left atrium: The atrium was mildly dilated. - Right atrium: The atrium was mildly dilated. - Pericardium, extracardiac: A trivial pericardial effusion was identified.   01/18/15 Had [place] Successful but after about a week she reverted back to afib  She did feel better in sinus   Her case was discussed with EP Dr Thompson Grayer  She is not a good candidate for amiodarone or Multaq due to hepatic disease and not a good candidate for flecainide with age/possible conduction disease (LAFB). The best options are sotalol and Tikosyn, with Dr. Rayann Heman favoring efficacy of Tikosyn. Patient was offered choice and initially chose Tikosyn  In interim she had another partial complex seizure and went to ER Keppra dose increased.   On interview today Husband indicates [employer] will not pay for Tikosyn.  He is seeking a 2nd opinion from Dr Metta Clines at [facility].  Of note she had one subRx INR at  1.73 on 02/03/15   ROS: Denies fever, malais, weight loss,  blurry vision, decreased visual acuity, cough, sputum, SOB, hemoptysis, pleuritic pain, palpitaitons, heartburn, abdominal pain, melena, lower extremity edema, claudication, or rash.  All other systems reviewed and negative  General: Affect appropriate Chronically ill white female  HEENT: normal Neck supple with no adenopathy JVP normal no bruits no thyromegaly Lungs clear with no wheezing and good diaphragmatic motion Heart:  S1/S2 AS  murmur, no rub, gallop or click PMI normal Abdomen: benighn, BS positve, no tenderness, no AAA no bruit.  No HSM or HJR Distal pulses intact with no bruits Trace bilateral edema Neuro non-focal Skin warm and dry No muscular weakness   Current Outpatient Prescriptions  Medication Sig Dispense Refill  . furosemide (LASIX) 20 MG tablet Take 1 tablet (20 mg total) by mouth daily. 90 tablet 3  . levETIRAcetam (KEPPRA) 1000 MG tablet Take 1 tablet (1,000 mg total) by mouth 2 (two) times daily. 60 tablet 2  . levofloxacin (LEVAQUIN) 750 MG tablet Take 1 tablet (750 mg total) by mouth daily. 5 tablet 0  . lisinopril (PRINIVIL,ZESTRIL) 2.5 MG tablet Take 1 tablet (2.5 mg total) by mouth daily. 90 tablet 3  . metoprolol tartrate (LOPRESSOR) 25 MG tablet Take 1 tablet (25 mg total) by mouth 2 (two) times daily. 60 tablet 1  . potassium chloride (K-DUR) 10 MEQ tablet Take  one 10 meq tablets in the morning and one 10 meq tablet at lunch time 180 tablet 3  . warfarin (COUMADIN) 5 MG tablet Take 1 tablet (5 mg  total) by mouth daily. (Patient taking differently: Take 5 mg by mouth daily. 5mg  on Mondays, 2.5mg  all other days) 90 tablet 3   No current facility-administered medications for this visit.    Allergies  Review of patient's allergies indicates no known allergies.  Electrocardiogram:  2/26  Afib rate 84  Poor R wave progression   Assessment and Plan

## 2015-02-06 NOTE — Assessment & Plan Note (Signed)
F/U echo 6 months  Likely to progress and she would be a TAVR candidate given comorbidities

## 2015-02-06 NOTE — Patient Instructions (Signed)
Your physician recommends that you schedule a follow-up appointment in:  [place] NEED TO DO  Your physician recommends that you continue on your current medications as directed. Please refer to the Current Medication list given to you today.

## 2015-02-06 NOTE — Assessment & Plan Note (Signed)
Failed DCC  That resotration of AV synchrony important given history of diastolic failure and AS  Given choice of tikosyn or sotolol and now wants 2nd opinion from [facility]  Will see Dr Metta Clines Thursday and see what she wants to do  May be insurance issues with Tikosyn although we have not had issues with tikosyn with [employer] before Continue coumadin

## 2015-02-06 NOTE — Progress Notes (Signed)
HISTORY OF PRESENT ILLNESS:  [name] is a 80 y.o. female with multiple significant medical problems as documented. I have not seen her nearly 10 years. At that time underwent routine screening colonoscopy which was negative except for diverticulosis and hyperplastic colon polyp. Recently sent to GI regarding abnormal liver tests and abnormal liver imaging suggesting hepatic cirrhosis. Workup has been negative thus far. Suspect this may be secondary to chronic fatty liver (long-standing obesity) and chronic alcohol use. Liver tests markedly elevated in the face of acute heart failure secondary to atrial fibrillation in the face of known valvular heart disease. Liver test abnormalities have resolved. Hepatic synthetic function intact. Recent pulmonary infection being treated with antibiotics. No history of GI bleeding. Being evaluated by Dr. Johnsie Cancel from cardiology. On chronic Coumadin. Last liver tests 01/29/2015 normal. The most part, liver tests in previous years normal. CT scan ultrasound suggest nodularity consistent with cirrhosis without other associated abnormalities. Patient is accompanied by her husband today who is quite actively involved as he provides history, multiple questions, and multiple opinions. Cardiology evaluation from this afternoon reviewed in detail prior to formulating this note.  REVIEW OF SYSTEMS:  All non-GI ROS negative except for heart murmur, heart rhythm change, fatigue,  Past Medical History  Diagnosis Date  . Epilepsy, focal August 2012    Initially diagnosed as TIA   . Essential hypertension   . Hyperlipidemia   . PVD (peripheral vascular disease)     Saw Dr Albertine Patricia 2007 - right common Iliac artery stenosis  . Uterine cancer   . Breast CA     Bilateral  . Atrial fibrillation     a. Dx 12/2014, s/p DCCV 01/2015. Xarelto changed to Coumadin due to elevated LFTs.  . Aortic stenosis     a. Mod-severe by echo 12/2014.  . Tachycardia   . Diastolic CHF     a. In  setting of AF.   [place] Kitchen Hepatic cirrhosis   . Colon polyps     hyperplastic  . Elevated LFTs   . Pulmonary hypertension     a. Mod by echo 12/2014.  . Diverticulosis   . Elevated LFTs     Past Surgical History  Procedure Laterality Date  . Mastectomy Bilateral 2007  . Total abdominal hysterectomy  1990s  . Breast lumpectomy Bilateral   . Cardioversion N/A 01/18/2015    Procedure: CARDIOVERSION;  Surgeon: Josue Hector, MD;  Location: [facility];  Service: Cardiovascular;  Laterality: N/A;    Social History [name]  reports that she has never smoked. She has never used smokeless tobacco. She reports that she drinks alcohol. She reports that she does not use illicit drugs.  family history includes Alzheimer's disease in her brother; Breast cancer in her daughter and other; Diabetes in her brother; Heart attack (age of onset: 68) in her father; Heart failure in her brother; Hypertension in her mother and sister; Kidney cancer in her brother; Kidney failure (age of onset: 1) in her mother; Seizures in her brother.  No Known Allergies     PHYSICAL EXAMINATION: Vital signs: BP 130/84 mmHg  Pulse 80  Ht 5\' 7"  (1.702 m)  Wt 173 lb (78.472 kg)  BMI 27.09 kg/m2  Constitutional: Chronically ill-appearing, no acute distress Psychiatric: alert and oriented x3, cooperative Eyes: extraocular movements intact, anicteric, conjunctiva pink Mouth: oral pharynx moist, no lesions Neck: supple no lymphadenopathy Cardiovascular: heart irregular rate and rhythm, no murmur Lungs: Mild rhonchi right base, otherwise clear to auscultation  bilaterally Abdomen: soft, nontender, nondistended, no obvious ascites, no peritoneal signs, normal bowel sounds, no organomegaly Rectal: Omitted trace Extremities: no lower extremity edema bilaterally Skin: no lesions on visible extremities Neuro: No focal deficits. No asterixis.    ASSESSMENT:  #1. Compensated hepatic cirrhosis. Likely secondary to  fatty liver and/or alcohol #2. Recent acute transient abnormalities of liver tests likely secondary to passive congestion in the face of heart failure. Both conditions have resolved #3. Multiple medical problems including chronic anticoagulation   PLAN:  #1. Obtain chronic hepatitis serologies for A and B. Plan to vaccinate if not immune #2. Discontinue alcohol. Still drinking some. Advised #3. Schedule diagnostic upper endoscopy to assess for esophageal varices. The patient is high-risk given her comorbidities and the presence of chronic anticoagulation which will be continued.The nature of the procedure, as well as the risks, benefits, and alternatives were carefully and thoroughly reviewed with the patient. Ample time for discussion and questions allowed. The patient understood, was satisfied, and agreed to proceed. #4. Would plan routine GI follow-up in 1 year. Sooner if needed

## 2015-02-06 NOTE — Patient Instructions (Signed)
Your physician has requested that you go to the basement for lab work before leaving today  You have been scheduled for an endoscopy. Please follow written instructions given to you at your visit today. If you use inhalers (even only as needed), please bring them with you on the day of your procedure.  

## 2015-02-07 ENCOUNTER — Encounter: Payer: Self-pay | Admitting: Internal Medicine

## 2015-02-07 ENCOUNTER — Ambulatory Visit (AMBULATORY_SURGERY_CENTER): Payer: Medicare Other | Admitting: Internal Medicine

## 2015-02-07 VITALS — BP 139/93 | HR 85 | Temp 96.8°F | Resp 37 | Ht 67.0 in | Wt 173.0 lb

## 2015-02-07 DIAGNOSIS — K253 Acute gastric ulcer without hemorrhage or perforation: Secondary | ICD-10-CM | POA: Diagnosis not present

## 2015-02-07 DIAGNOSIS — K7469 Other cirrhosis of liver: Secondary | ICD-10-CM

## 2015-02-07 LAB — HEPATITIS A ANTIBODY, TOTAL: Hep A Total Ab: REACTIVE — AB

## 2015-02-07 LAB — HEPATITIS B SURFACE ANTIBODY,QUALITATIVE: Hep B S Ab: NEGATIVE

## 2015-02-07 MED ORDER — SODIUM CHLORIDE 0.9 % IV SOLN: 500.0000 mL | INTRAVENOUS | Status: DC

## 2015-02-07 MED ORDER — OMEPRAZOLE 20 MG PO CPDR: 20.0000 mg | DELAYED_RELEASE_CAPSULE | Freq: Every day | ORAL | Status: DC

## 2015-02-07 MED ORDER — OMEPRAZOLE 20 MG PO CPDR: 20.0000 mg | DELAYED_RELEASE_CAPSULE | Freq: Every day | ORAL | Status: AC

## 2015-02-07 NOTE — Patient Instructions (Signed)
YOU HAD AN ENDOSCOPIC PROCEDURE TODAY AT [facility]:   Refer to the procedure report that was given to you for any specific questions about what was found during the examination.  If the procedure report does not answer your questions, please call your gastroenterologist to clarify.  If you requested that your care partner not be given the details of your procedure findings, then the procedure report has been included in a sealed envelope for you to review at your convenience later.  YOU SHOULD EXPECT: Some feelings of bloating in the abdomen. Passage of more gas than usual.  Walking can help get rid of the air that was put into your GI tract during the procedure and reduce the bloating. If you had a lower endoscopy (such as a colonoscopy or flexible sigmoidoscopy) you may notice spotting of blood in your stool or on the toilet paper. If you underwent a bowel prep for your procedure, you may not have a normal bowel movement for a few days.  Please Note:  You might notice some irritation and congestion in your nose or some drainage.  This is from the oxygen used during your procedure.  There is no need for concern and it should clear up in a day or so.  SYMPTOMS TO REPORT IMMEDIATELY:   Following upper endoscopy (EGD)  Vomiting of blood or coffee ground material  New chest pain or pain under the shoulder blades  Painful or persistently difficult swallowing  New shortness of breath  Fever of 100F or higher  Black, tarry-looking stools  A gastroenterologist can be reached at any hour by calling [phone].   DIET: Your first meal following the procedure should be a small meal and then it is ok to progress to your normal diet. Heavy or fried foods are harder to digest and may make you feel nauseous or bloated.  Likewise, meals heavy in dairy and vegetables can increase bloating.  Drink plenty of fluids but you should avoid alcoholic beverages for 24 hours.  ACTIVITY:  You  should plan to take it easy for the rest of today and you should NOT DRIVE or use heavy machinery until tomorrow (because of the sedation medicines used during the test).    FOLLOW UP: Our staff will call the number listed on your records the next business day following your procedure to check on you and address any questions or concerns that you may have regarding the information given to you following your procedure. If we do not reach you, we will leave a message.  However, if you are feeling well and you are not experiencing any problems, there is no need to return our call.  We will assume that you have returned to your regular daily activities without incident.  If any biopsies were taken you will be contacted by phone or by letter within the next 1-3 weeks.  Please call us at [phone] if you have not heard about the biopsies in 3 weeks.    SIGNATURES/CONFIDENTIALITY: You and/or your care partner have signed paperwork which will be entered into your electronic medical record.  These signatures attest to the fact that that the information above on your After Visit Summary has been reviewed and is understood.  Full responsibility of the confidentiality of this discharge information lies with you and/or your care-partner.  Recommendations Biopsy results pending Omeprazole 20 mg daily. Follow up visit with Dr. Henrene Pastor in 1 year.

## 2015-02-07 NOTE — Progress Notes (Signed)
Called to room to assist during endoscopic procedure.  Patient ID and intended procedure confirmed with present staff. Received instructions for my participation in the procedure from the performing physician.  

## 2015-02-07 NOTE — Op Note (Signed)
[facility]  [address] [place], [id number]   ENDOSCOPY PROCEDURE REPORT  PATIENT: [name], [name]  MR#: [id number] BIRTHDATE: 12-21-1934 , 52  yrs. old GENDER: female ENDOSCOPIST: Eustace Quail, MD REFERRED BY:  Jenkins Rouge, M.D. PROCEDURE DATE:  02/07/2015 PROCEDURE:  EGD w/ biopsy ASA CLASS:     Class III INDICATIONS:  screening for varices. MEDICATIONS: Monitored anesthesia care and Propofol 70 mg IV    , lidocaine 150 mg IV TOPICAL ANESTHETIC: none  DESCRIPTION OF PROCEDURE: After the risks benefits and alternatives of the procedure were thoroughly explained, informed consent was obtained.  The [id number] endoscope was introduced through the mouth and advanced to the second portion of the duodenum , Without limitations.  The instrument was slowly withdrawn as the mucosa was fully examined.    Exam: The esophagus revealed mild distal esophagitis as manifested by erythema and edema of the mucosal Z line.  The esophagus was otherwise normal without obvious varices.  Stomach revealed multiple prepyloric erosions but was otherwise normal.  The duodenum was normal.  CLO biopsy taken.  Retroflexed views revealed no abnormalities.     The scope was then withdrawn from the patient and the procedure completed.  COMPLICATIONS: There were no immediate complications.  ENDOSCOPIC IMPRESSION: 1. Mild reflux esophagitis. No esophageal varices 2. Multiple antral erosions status post CLO biopsy  RECOMMENDATIONS: 1.  Rx CLO if positive 2.  Recommend omeprazole 20 mg daily indefinitely to protect upper GI mucosap. This is particularly important since you aree on a blood thinner 3. Routine office follow-up with Dr. Henrene Pastor in 1 year  REPEAT EXAM:  eSigned:  Eustace Quail, MD 02/07/2015 9:30 AM    KC:MKLKJ Tommy Rainwater, MD, Kathlene November, MD, and The Patient

## 2015-02-07 NOTE — Progress Notes (Signed)
Report to PACU, RN, vss, BBS= Clear.  

## 2015-02-08 ENCOUNTER — Telehealth: Payer: Self-pay | Admitting: *Deleted

## 2015-02-08 LAB — HELICOBACTER PYLORI SCREEN-BIOPSY: UREASE: NEGATIVE

## 2015-02-08 MED ORDER — METOPROLOL TARTRATE 25 MG PO TABS: 25.0000 mg | ORAL_TABLET | Freq: Two times a day (BID) | ORAL | Status: DC

## 2015-02-08 MED ORDER — METOPROLOL TARTRATE 25 MG PO TABS: 25.0000 mg | ORAL_TABLET | Freq: Two times a day (BID) | ORAL | Status: AC

## 2015-02-08 NOTE — Telephone Encounter (Signed)
  Follow up Call-  Call back number 02/07/2015  Post procedure Call Back phone  # [phone]  Permission to leave phone message Yes     Patient questions:  Do you have a fever, pain , or abdominal swelling? No. Pain Score  0 *  Have you tolerated food without any problems? Yes.    Have you been able to return to your normal activities? Yes.    Do you have any questions about your discharge instructions: Diet   No. Medications  No. Follow up visit  No.  Do you have questions or concerns about your Care? No.  Actions: * If pain score is 4 or above: No action needed, pain <4.

## 2015-02-08 NOTE — Telephone Encounter (Signed)
Patient husband states that [facility] on [address] did not receive this refill. Please resend.

## 2015-02-08 NOTE — Telephone Encounter (Signed)
Metoprolol (90 day supply) sent to [facility] and to [facility] as requested.

## 2015-02-08 NOTE — Addendum Note (Signed)
Addended by: Wilfrid Lund on: 02/08/2015 04:16 PM   Modules accepted: Orders

## 2015-02-09 LAB — CULTURE, BLOOD (ROUTINE X 2)
CULTURE: NO GROWTH
CULTURE: NO GROWTH
Culture: NO GROWTH
Culture: NO GROWTH

## 2015-02-12 ENCOUNTER — Encounter: Payer: Self-pay | Admitting: Cardiovascular Disease

## 2015-02-13 ENCOUNTER — Encounter: Payer: Self-pay | Admitting: Internal Medicine

## 2015-03-13 ENCOUNTER — Encounter: Payer: Medicare Other | Admitting: Internal Medicine

## 2015-03-20 DIAGNOSIS — Z9889 Other specified postprocedural states: Secondary | ICD-10-CM

## 2015-03-20 HISTORY — DX: Other specified postprocedural states: Z98.890

## 2015-03-26 ENCOUNTER — Other Ambulatory Visit: Payer: Self-pay

## 2015-03-26 DIAGNOSIS — G40209 Localization-related (focal) (partial) symptomatic epilepsy and epileptic syndromes with complex partial seizures, not intractable, without status epilepticus: Secondary | ICD-10-CM | POA: Insufficient documentation

## 2015-03-28 DIAGNOSIS — C50919 Malignant neoplasm of unspecified site of unspecified female breast: Secondary | ICD-10-CM | POA: Insufficient documentation

## 2015-04-06 DIAGNOSIS — I2510 Atherosclerotic heart disease of native coronary artery without angina pectoris: Secondary | ICD-10-CM | POA: Insufficient documentation

## 2015-04-06 DIAGNOSIS — J95822 Acute and chronic postprocedural respiratory failure: Secondary | ICD-10-CM | POA: Insufficient documentation

## 2015-04-06 DIAGNOSIS — J95821 Acute postprocedural respiratory failure: Secondary | ICD-10-CM | POA: Insufficient documentation

## 2015-04-19 LAB — BASIC METABOLIC PANEL
BUN: 32 mg/dL — AB (ref 4–21)
Creatinine: 0.6 mg/dL (ref 0.5–1.1)
GLUCOSE: 106 mg/dL
Potassium: 3.6 mmol/L (ref 3.4–5.3)
SODIUM: 147 mmol/L (ref 137–147)

## 2015-04-21 LAB — BASIC METABOLIC PANEL
BUN: 34 mg/dL — AB (ref 4–21)
Creatinine: 0.8 mg/dL (ref 0.5–1.1)
GLUCOSE: 130 mg/dL
Potassium: 3.9 mmol/L (ref 3.4–5.3)
Sodium: 145 mmol/L (ref 137–147)

## 2015-04-22 LAB — BASIC METABOLIC PANEL
BUN: 34 mg/dL — AB (ref 4–21)
Creatinine: 0.7 mg/dL (ref 0.5–1.1)
Glucose: 126 mg/dL
Potassium: 3.6 mmol/L (ref 3.4–5.3)
Sodium: 143 mmol/L (ref 137–147)

## 2015-04-24 LAB — BASIC METABOLIC PANEL
BUN: 31 mg/dL — AB (ref 4–21)
BUN: 28 mg/dL — AB (ref 4–21)
BUN: 30 mg/dL — AB (ref 4–21)
CREATININE: 0.6 mg/dL (ref 0.5–1.1)
Creatinine: 0.5 mg/dL (ref 0.5–1.1)
Creatinine: 0.5 mg/dL (ref 0.5–1.1)
GLUCOSE: 82 mg/dL
Glucose: 83 mg/dL
Glucose: 89 mg/dL
POTASSIUM: 3.1 mmol/L — AB (ref 3.4–5.3)
Potassium: 3.1 mmol/L — AB (ref 3.4–5.3)
Potassium: 4.2 mmol/L (ref 3.4–5.3)
Sodium: 140 mmol/L (ref 137–147)
Sodium: 142 mmol/L (ref 137–147)
Sodium: 143 mmol/L (ref 137–147)

## 2015-04-24 LAB — CBC AND DIFFERENTIAL
HCT: 23 % — AB (ref 36–46)
HCT: 31 % — AB (ref 36–46)
HEMATOCRIT: 24 % — AB (ref 36–46)
HEMOGLOBIN: 7.3 g/dL — AB (ref 12.0–16.0)
Hemoglobin: 7.2 g/dL — AB (ref 12.0–16.0)
Hemoglobin: 9.6 g/dL — AB (ref 12.0–16.0)
Platelets: 250 10*3/uL (ref 150–399)
Platelets: 259 10*3/uL (ref 150–399)
Platelets: 265 10*3/uL (ref 150–399)
WBC: 12.1 10^3/mL
WBC: 12 10^3/mL
WBC: 13.3 10*3/mL

## 2015-04-25 LAB — CBC AND DIFFERENTIAL
HCT: 28 % — AB (ref 36–46)
Hemoglobin: 8.8 g/dL — AB (ref 12.0–16.0)
PLATELETS: 265 10*3/uL (ref 150–399)
WBC: 10.9 10*3/mL

## 2015-04-25 LAB — BASIC METABOLIC PANEL
BUN: 28 mg/dL — AB (ref 4–21)
Creatinine: 0.5 mg/dL (ref 0.5–1.1)
Glucose: 119 mg/dL
Potassium: 3.3 mmol/L — AB (ref 3.4–5.3)
SODIUM: 141 mmol/L (ref 137–147)

## 2015-05-23 DIAGNOSIS — I509 Heart failure, unspecified: Secondary | ICD-10-CM | POA: Insufficient documentation

## 2015-05-23 DIAGNOSIS — R7881 Bacteremia: Secondary | ICD-10-CM | POA: Insufficient documentation

## 2015-06-08 DEATH — deceased

## 2016-04-10 ENCOUNTER — Telehealth: Payer: Self-pay | Admitting: Internal Medicine

## 2016-04-10 NOTE — Telephone Encounter (Signed)
Is A + according to the records

## 2016-04-10 NOTE — Telephone Encounter (Signed)
Family informed.

## 2016-04-10 NOTE — Telephone Encounter (Signed)
Caller name: Dr. Wetzel Bjornstad  Relation to pt: Call back [place] or [phone] Pharmacy:  Reason for call:  Spouse called inquiring about patient blood type, states he's gathering family history. Please advise

## 2016-07-22 IMAGING — CR DG CHEST 2V
2 series · 2 of 2 positions shown · non-contrast
Comparison: 12/15/2014

CLINICAL DATA: Pleural effusion, atrial fibrillation, history of
breast cancer

EXAM:
CHEST  2 VIEW

[w chest pa]
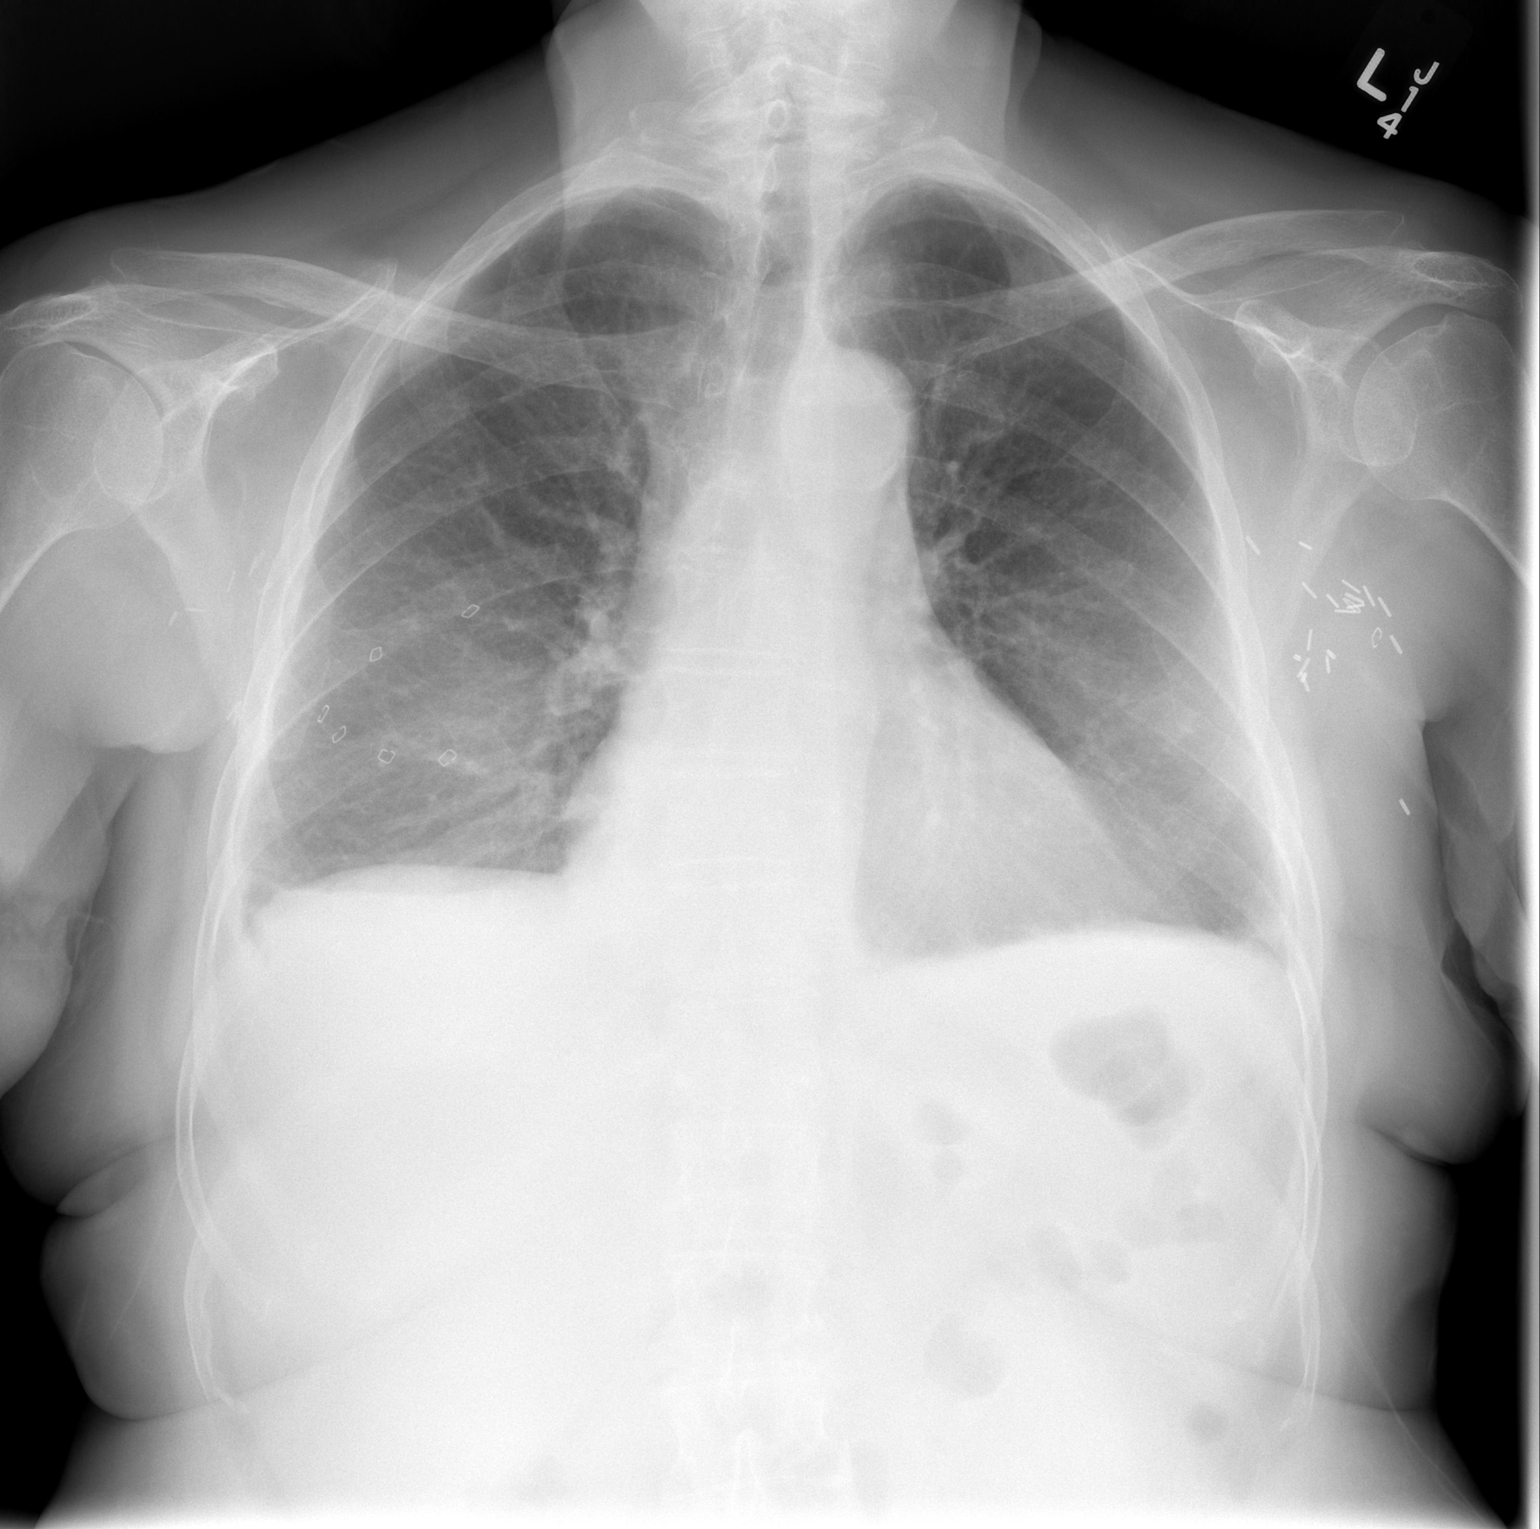

[w chest lat]
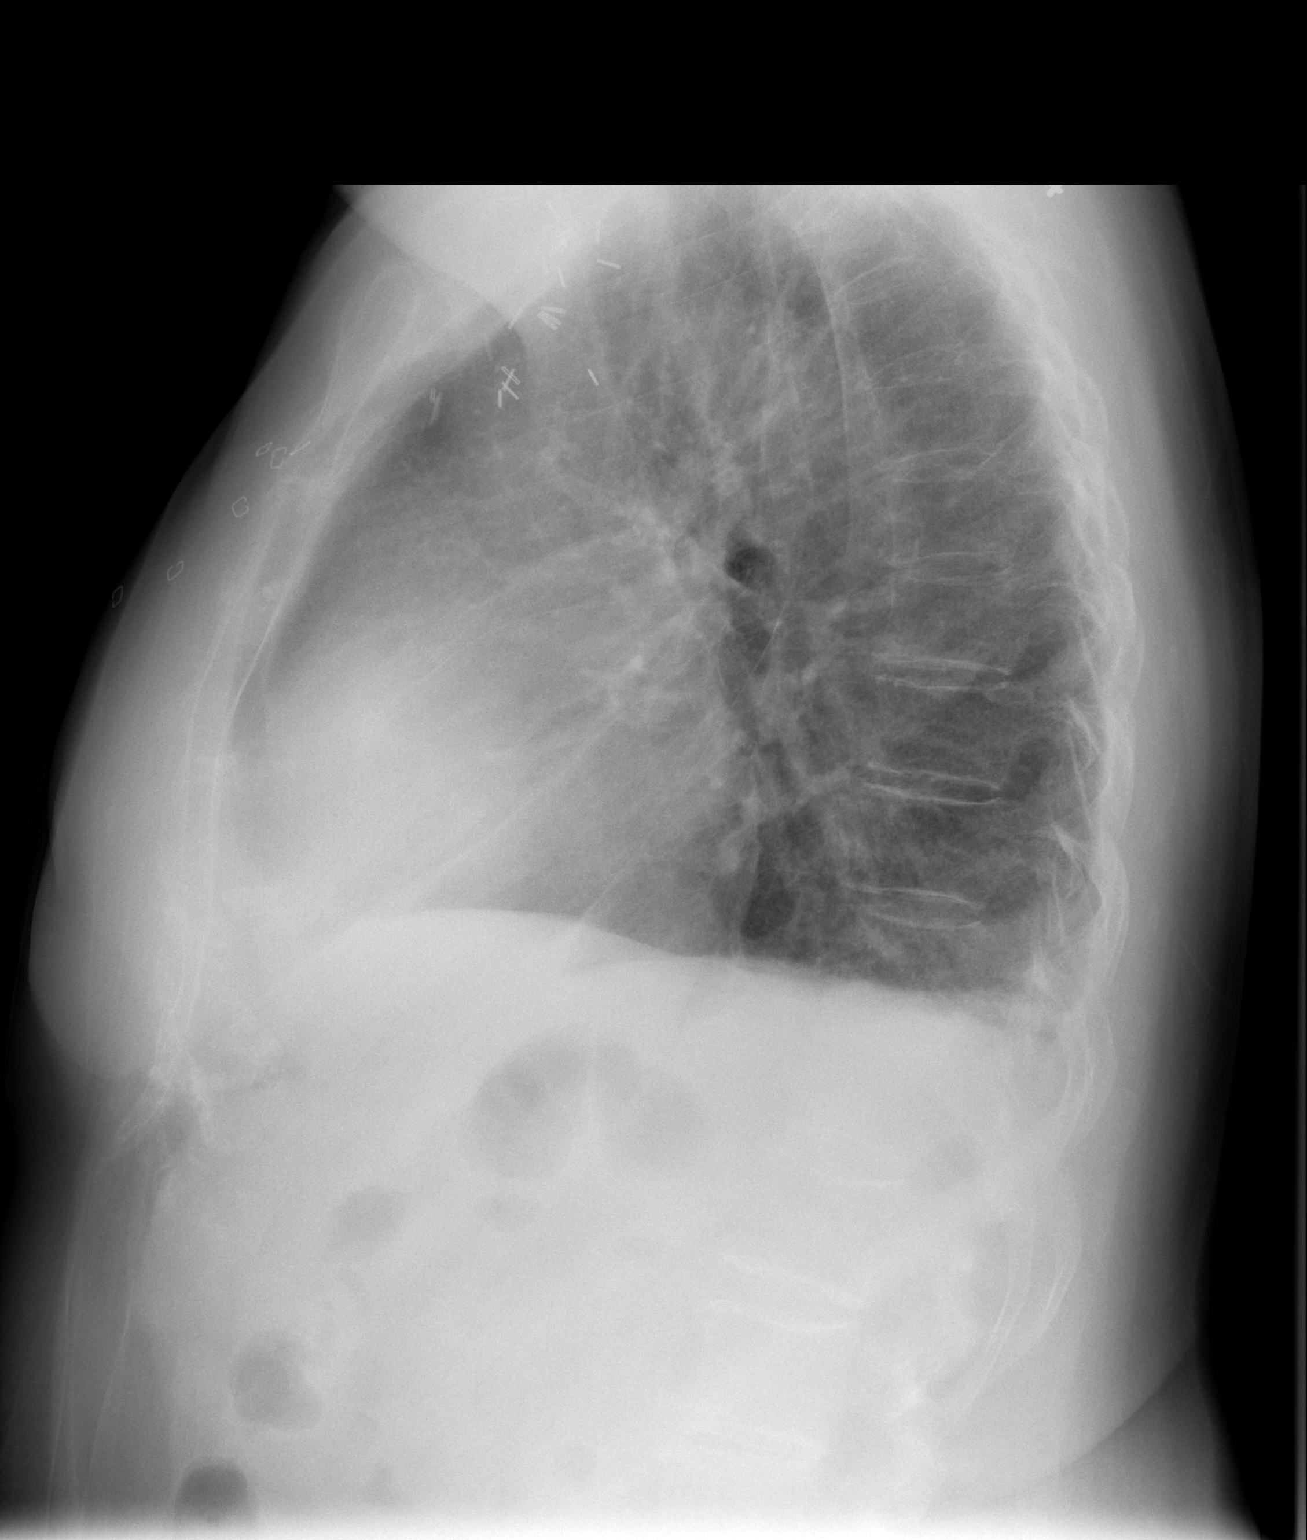

[2 of 2 positions shown; findings below may reference images not displayed]

FINDINGS: Trace bilateral pleural effusions. Mild bilateral interstitial
thickening. No focal consolidation or pneumothorax. Stable
cardiomediastinal silhouette. Thoracic aortic atherosclerosis.
Surgical clips in bilateral axilla.

The osseous structures are unremarkable.
IMPRESSION: Trace bilateral pleural effusions and mild interstitial edema.

## 2016-07-30 IMAGING — CT CT ABD-PEL WO/W CM
1 of 4 series · 12 of 32 positions shown, 18 images · IV contrast (Omnipaque 300)
Comparison: None.

CLINICAL DATA: Evaluate for cirrhosis. History of breast cancer and
bilateral mastectomy.

EXAM:
CT ABDOMEN AND PELVIS WITHOUT AND WITH CONTRAST
TECHNIQUE: Multidetector CT imaging of the abdomen and pelvis was performed
following the standard protocol before and following the bolus
administration of intravenous contrast.
CONTRAST:  100mL OMNIPAQUE IOHEXOL 300 MG/ML  SOLN

[Series 6: liver portal-venous · axial · portal-venous · 0.72mm/px · z∈[-501,-102]mm · 12 of 157 slices shown, 18 images]
[im 12/157  soft-tissue]
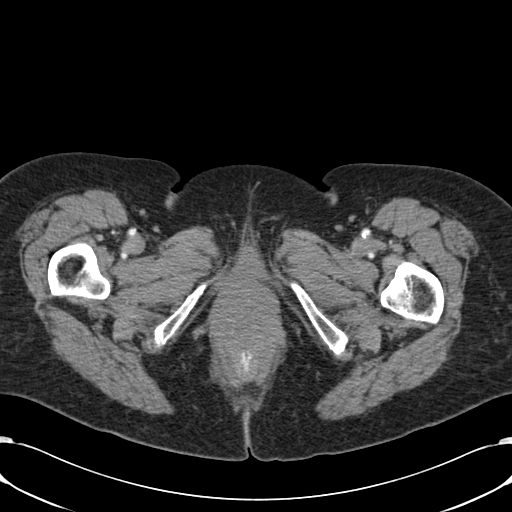
[im 12/157  bone]
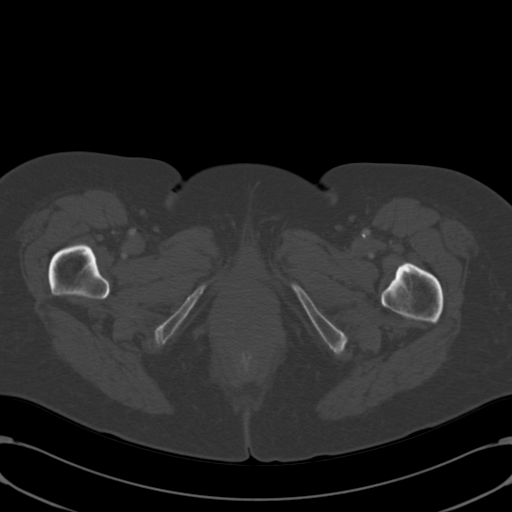
[im 23/157  soft-tissue]
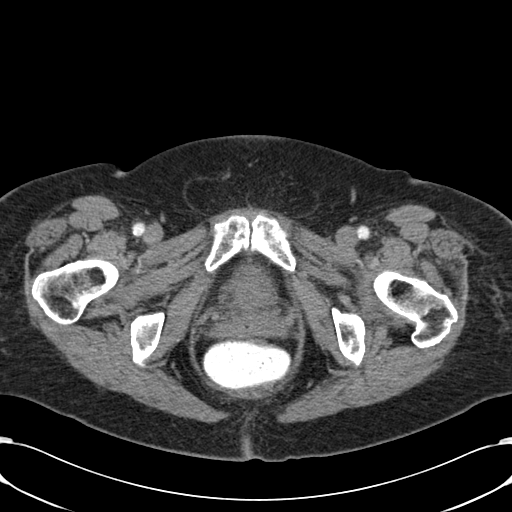
[im 34/157  soft-tissue]
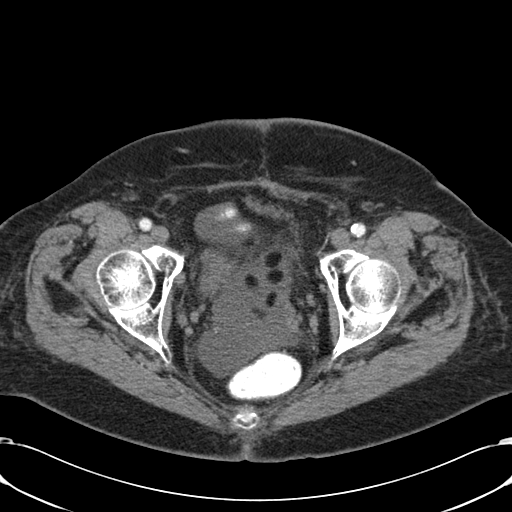
[im 45/157  soft-tissue]
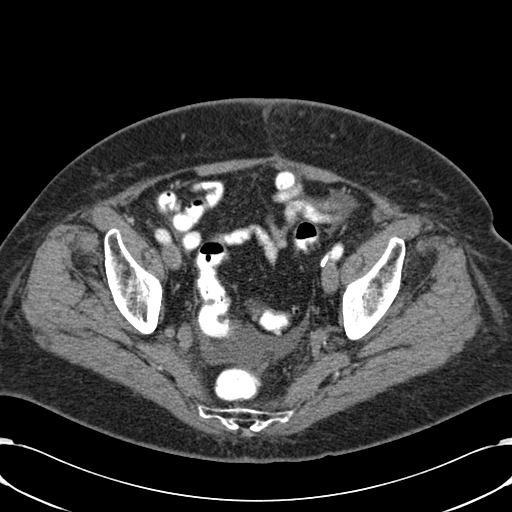
[im 56/157  soft-tissue]
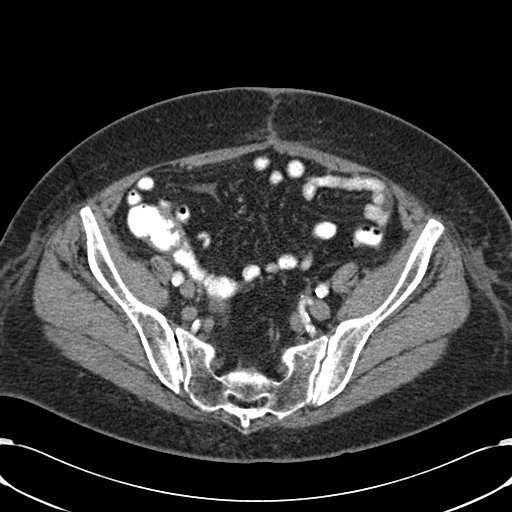
[im 67/157  soft-tissue]
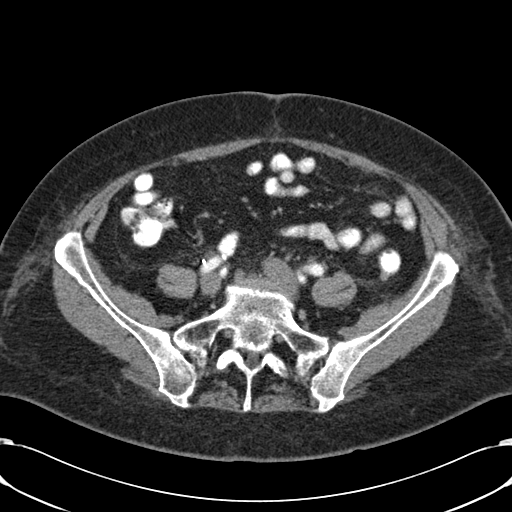
[im 90/157  soft-tissue]
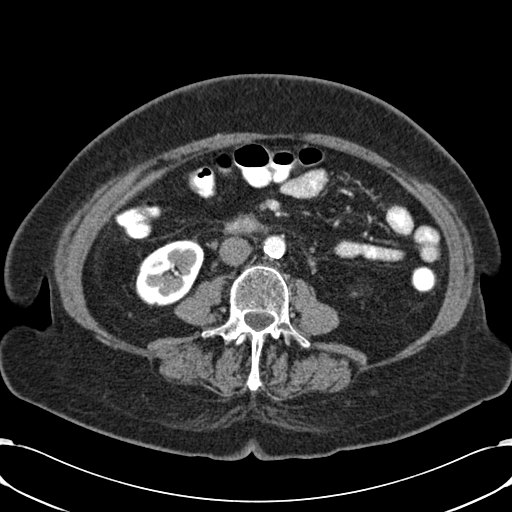
[im 101/157  soft-tissue]
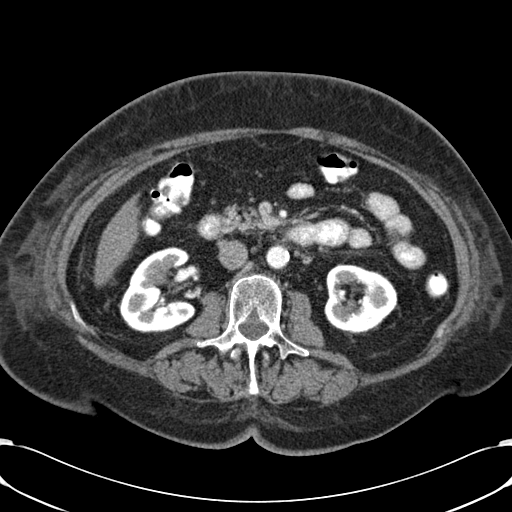
[im 112/157  soft-tissue]
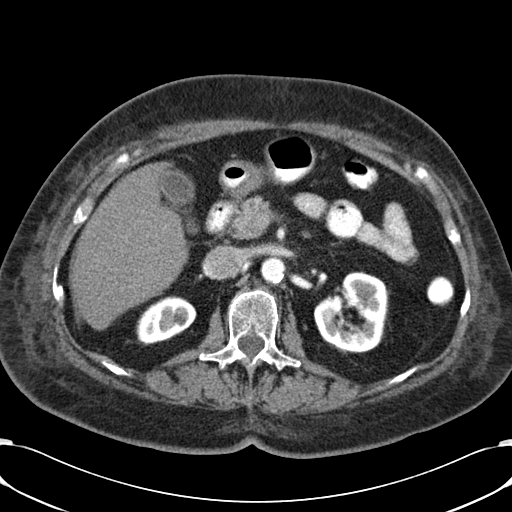
[im 112/157  lung]
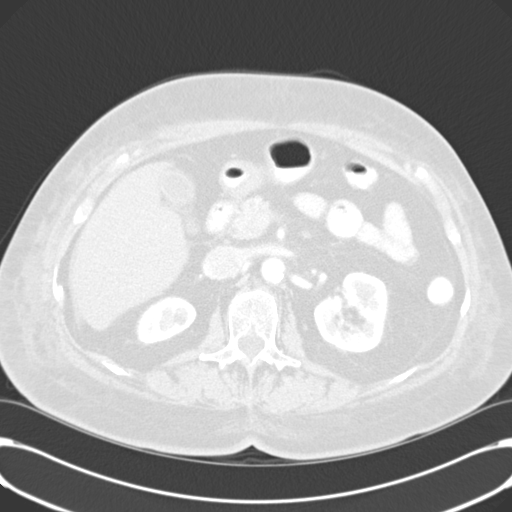
[im 112/157  bone]
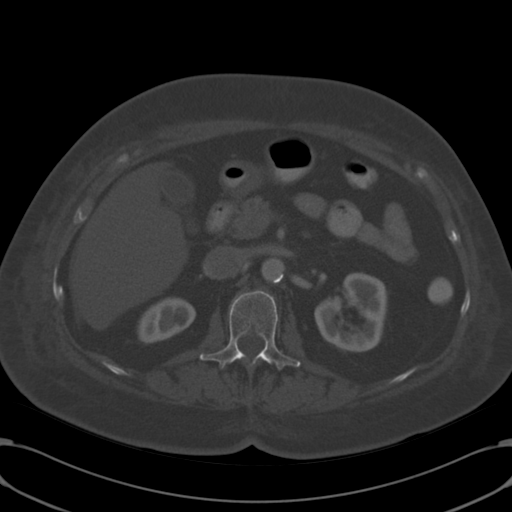
[im 123/157  soft-tissue]
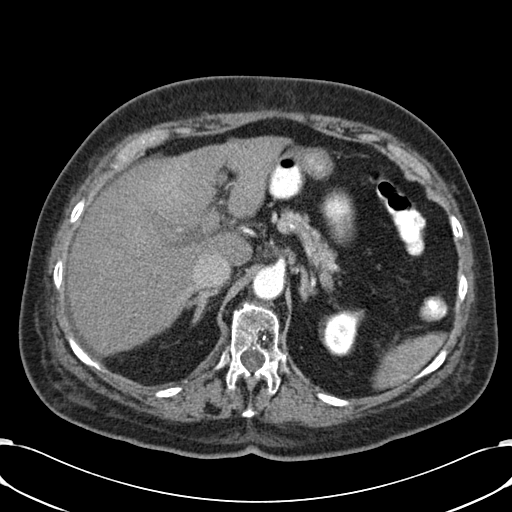
[im 123/157  lung]
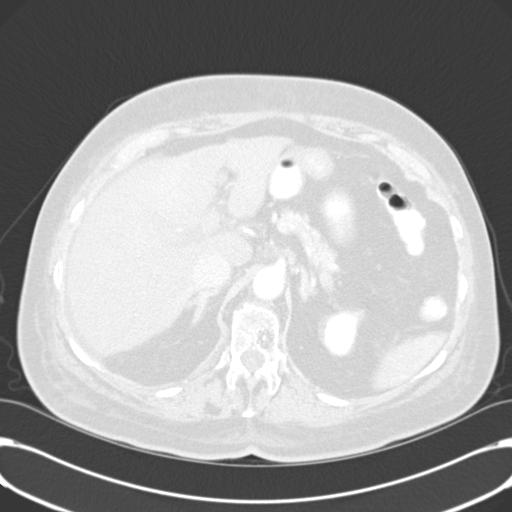
[im 134/157  soft-tissue]
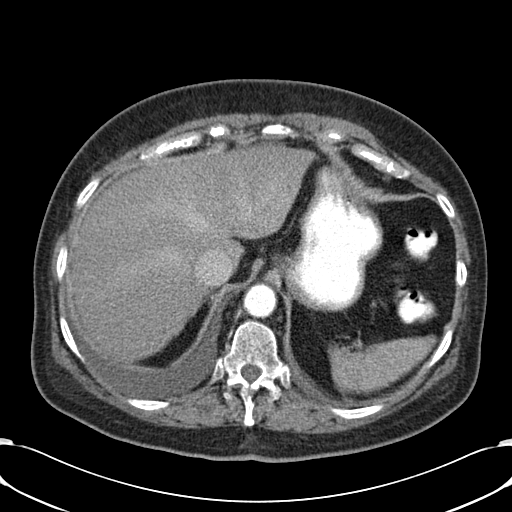
[im 134/157  lung]
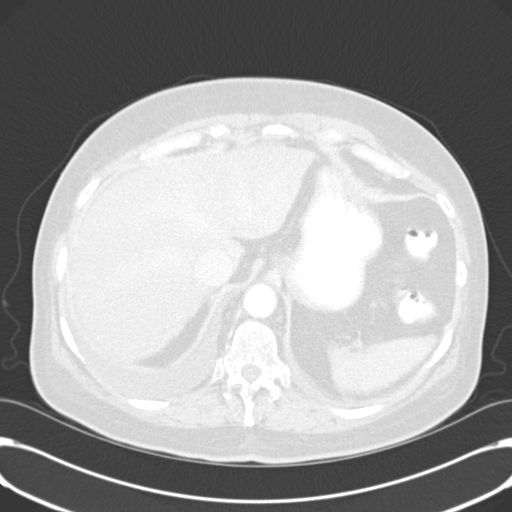
[im 145/157  soft-tissue]
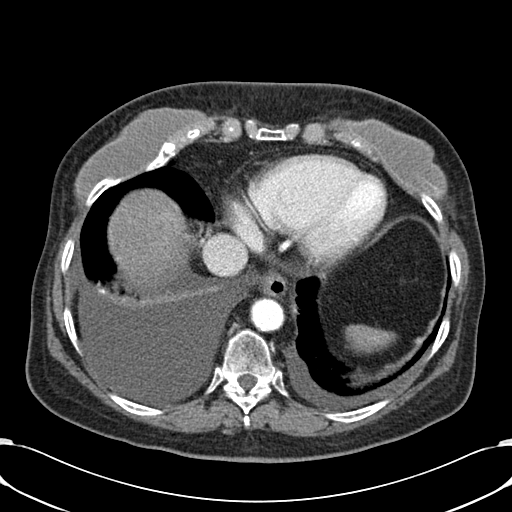
[im 145/157  lung]
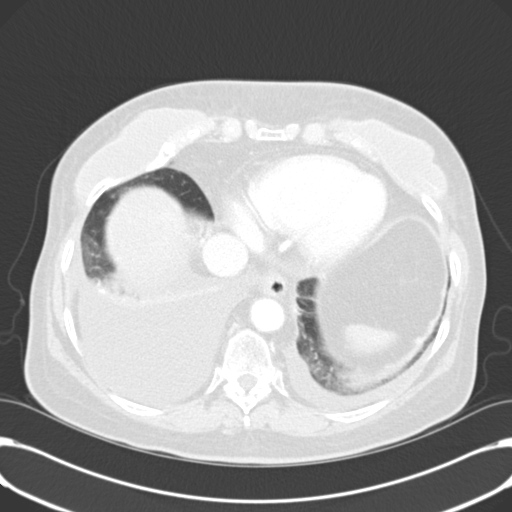

[12 of 32 positions shown; findings below may reference images not displayed]

FINDINGS: Lower chest: Enlargement of the right atrium and right ventricle
identified. There are bilateral pleural effusions right greater than
left.

Hepatobiliary: Reflux of contrast material from the right atrium
into the hepatic veins identified compatible with passive venous
congestion. The contour of the liver is slightly irregular. No focal
liver abnormality identified. No enhancing liver lesions identified
to suggest hepatoma. The gallbladder appears normal. No biliary
dilatation.

Pancreas: Normal appearance of the pancreas.

Spleen: The spleen is normal.

Adrenals/Urinary Tract: The adrenal glands are unremarkable. No
focal kidney abnormality identified. The urinary bladder is
unremarkable.

Stomach/Bowel: The stomach and the small bowel loops have a normal
course and caliber. There is no evidence for bowel obstruction.
Normal appearance of the colon.

Vascular/Lymphatic: Calcified atherosclerotic disease involves the
abdominal aorta. No aneurysm. There is no upper abdominal adenopathy
identified. No pelvic or inguinal adenopathy.

Reproductive: Previous hysterectomy.  No adnexal mass identified.

Other: Mild to moderate abdominal and pelvic ascites identified.

Musculoskeletal: Review of the visualized bony structures is
negative for aggressive lytic or sclerotic bone lesion.
IMPRESSION: 1. Mild cirrhosis with abdominal and pelvic ascites.
2. Suspect right heart failure. There is enlargement of the right
atrium and right ventricle and reflux of contrast material into the
hepatic veins consistent with passive venous congestion. Bilateral
pleural effusions are present right greater than left.
